# Patient Record
Sex: Female | Born: 1970 | Race: White | Hispanic: No | Marital: Married | Smoking: Current every day smoker
Health system: Southern US, Community
[De-identification: ages and names within clinical notes are randomized; demographics above are authoritative.]

## PROBLEM LIST (undated history)

## (undated) DIAGNOSIS — K219 Gastro-esophageal reflux disease without esophagitis: Secondary | ICD-10-CM

## (undated) DIAGNOSIS — E785 Hyperlipidemia, unspecified: Secondary | ICD-10-CM

## (undated) DIAGNOSIS — I10 Essential (primary) hypertension: Secondary | ICD-10-CM

## (undated) DIAGNOSIS — M7989 Other specified soft tissue disorders: Secondary | ICD-10-CM

## (undated) DIAGNOSIS — G43909 Migraine, unspecified, not intractable, without status migrainosus: Secondary | ICD-10-CM

## (undated) DIAGNOSIS — Z720 Tobacco use: Secondary | ICD-10-CM

## (undated) DIAGNOSIS — E559 Vitamin D deficiency, unspecified: Secondary | ICD-10-CM

## (undated) DIAGNOSIS — K5790 Diverticulosis of intestine, part unspecified, without perforation or abscess without bleeding: Secondary | ICD-10-CM

## (undated) DIAGNOSIS — J439 Emphysema, unspecified: Secondary | ICD-10-CM

## (undated) DIAGNOSIS — G939 Disorder of brain, unspecified: Secondary | ICD-10-CM

## (undated) DIAGNOSIS — E538 Deficiency of other specified B group vitamins: Secondary | ICD-10-CM

## (undated) DIAGNOSIS — M1990 Unspecified osteoarthritis, unspecified site: Secondary | ICD-10-CM

## (undated) DIAGNOSIS — F419 Anxiety disorder, unspecified: Secondary | ICD-10-CM

## (undated) DIAGNOSIS — E876 Hypokalemia: Secondary | ICD-10-CM

## (undated) HISTORY — DX: Migraine, unspecified, not intractable, without status migrainosus: G43.909

## (undated) HISTORY — DX: Unspecified osteoarthritis, unspecified site: M19.90

## (undated) HISTORY — DX: Gastro-esophageal reflux disease without esophagitis: K21.9

## (undated) HISTORY — PX: COLONOSCOPY WITH ESOPHAGOGASTRODUODENOSCOPY (EGD): SHX5779

## (undated) HISTORY — DX: Essential (primary) hypertension: I10

## (undated) HISTORY — DX: Hyperlipidemia, unspecified: E78.5

## (undated) HISTORY — PX: OTHER SURGICAL HISTORY: SHX169

## (undated) HISTORY — PX: ABDOMINAL HYSTERECTOMY: SHX81

---

## 2005-12-15 ENCOUNTER — Emergency Department: Payer: Self-pay | Admitting: Emergency Medicine

## 2006-01-01 ENCOUNTER — Emergency Department: Payer: Self-pay | Admitting: Emergency Medicine

## 2008-10-18 ENCOUNTER — Emergency Department: Payer: Self-pay | Admitting: Internal Medicine

## 2008-11-29 ENCOUNTER — Ambulatory Visit: Payer: Self-pay | Admitting: Gastroenterology

## 2009-06-02 ENCOUNTER — Emergency Department: Payer: Self-pay | Admitting: Emergency Medicine

## 2010-06-06 ENCOUNTER — Ambulatory Visit: Payer: Self-pay | Admitting: Family Medicine

## 2010-12-14 ENCOUNTER — Emergency Department: Payer: Self-pay | Admitting: Emergency Medicine

## 2011-10-09 ENCOUNTER — Emergency Department: Payer: Self-pay | Admitting: *Deleted

## 2014-01-26 ENCOUNTER — Emergency Department: Payer: Self-pay | Admitting: Emergency Medicine

## 2015-01-29 ENCOUNTER — Encounter: Payer: Self-pay | Admitting: Emergency Medicine

## 2015-01-29 ENCOUNTER — Emergency Department
Admission: EM | Admit: 2015-01-29 | Discharge: 2015-01-29 | Disposition: A | Discharge status: Home / Self Care | Payer: Self-pay | Attending: Student | Admitting: Student

## 2015-01-29 DIAGNOSIS — Z720 Tobacco use: Secondary | ICD-10-CM | POA: Insufficient documentation

## 2015-01-29 DIAGNOSIS — G5602 Carpal tunnel syndrome, left upper limb: Secondary | ICD-10-CM | POA: Insufficient documentation

## 2015-01-29 DIAGNOSIS — M7712 Lateral epicondylitis, left elbow: Secondary | ICD-10-CM | POA: Insufficient documentation

## 2015-01-29 MED ORDER — IBUPROFEN 600 MG PO TABS
ORAL_TABLET | ORAL | Status: AC
  Administered 2015-01-29: 600 mg
  Filled 2015-01-29: qty 1

## 2015-01-29 MED ORDER — IBUPROFEN 600 MG PO TABS: 600.0000 mg | ORAL_TABLET | Freq: Four times a day (QID) | ORAL | Status: DC | PRN

## 2015-01-29 NOTE — ED Provider Notes (Addendum)
[facility] Emergency Department Provider Note  ____________________________________________  Time seen: Approximately 3:59 AM  I have reviewed the triage vital signs and the nursing notes.   HISTORY  Chief Complaint Elbow Pain    HPI Haley Hester is a 44 y.o. female with no chronic medical problems who presents for evaluation of several weeks' intermittent sharp shooting pains from the elbow into her left hand, usually sudden onset and precipitated by her bumping her left elbow on something. Currently her pain is mild. 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 She denies any recent significant trauma to the elbow. She denies any weakness. Currently her symptoms are mild. She has no other complaints.   History reviewed. No pertinent past medical history.  There are no active problems to display for this patient.   Past Surgical History  Procedure Laterality Date  . Abdominal hysterectomy      No current outpatient prescriptions on file.  Allergies Review of patient's allergies indicates no known allergies.  History reviewed. No pertinent family history.  Social History Social History  Substance Use Topics  . Smoking status: Current Every Day Smoker -- 0.50 packs/day    Types: Cigarettes  . Smokeless tobacco: None  . Alcohol Use: Yes    Review of Systems Constitutional: No fever/chills Eyes: No visual changes. ENT: No sore throat. Cardiovascular: Denies chest pain. Respiratory: Denies shortness of breath. Gastrointestinal: No abdominal pain.  No nausea, no vomiting.  No diarrhea.  No constipation. Genitourinary: Negative for dysuria. Musculoskeletal: Negative for back pain. Skin: Negative for rash. Neurological: Negative for headaches, focal weakness or numbness.  10-point ROS otherwise  negative.  ____________________________________________   PHYSICAL EXAM:  VITAL SIGNS: ED Triage Vitals  Enc Vitals Group     BP 01/29/15 0333 174/105 mmHg     Pulse Rate 01/29/15 0333 83     Resp 01/29/15 0333 18     Temp 01/29/15 0333 98 F (36.7 C)     Temp Source 01/29/15 0333 Oral     SpO2 01/29/15 0333 98 %     Weight 01/29/15 0333 150 lb (68.04 kg)     Height 01/29/15 0333 5\' 8"  (1.727 m)     Head Cir --      Peak Flow --      Pain Score 01/29/15 0335 0     Pain Loc --      Pain Edu? --      Excl. in [place]? --     Constitutional: Alert and oriented. Well appearing and in no acute distress. Eyes: Conjunctivae are normal. PERRL. EOMI. Head: Atraumatic. Nose: No congestion/rhinnorhea. Mouth/Throat: Mucous membranes are moist.  Oropharynx non-erythematous. Neck: No stridor.  Cardiovascular: Normal rate, regular rhythm. Grossly normal heart sounds.  Good peripheral circulation. Respiratory: Normal respiratory effort.  No retractions. Lungs CTAB. Gastrointestinal: Soft and nontender. No distention. No abdominal bruits. No CVA tenderness. Genitourinary: deferred Musculoskeletal: Very faint tenderness of the elbow but full range of motion and range of motion appears painless, there is no warmth or erythema or effusion associated with the joint, 2+ left radial pulse, motor function of the radial, median and ulnar nerve are intact. She has slightly diminished sensation to light touch in the palmar aspect of the left index finger. Neurologic:  Normal speech and language. No gross focal neurologic deficits are appreciated. No gait instability. Skin:  Skin is warm, dry and intact. No rash noted. Psychiatric: Mood and affect are normal. Speech and behavior are normal.  ____________________________________________   LABS (all labs ordered are listed, but only abnormal results are displayed)  Labs Reviewed - No data to  display ____________________________________________  EKG  none ____________________________________________  RADIOLOGY  none ____________________________________________   PROCEDURES  Procedure(s) performed: None  Critical Care performed: No  ____________________________________________   INITIAL IMPRESSION / ASSESSMENT AND PLAN / ED COURSE  Pertinent labs & imaging results that were available during my care of the patient were reviewed by me and considered in my medical decision making (see chart for details).  Haley Hester is a 44 y.o. female with no chronic medical problems who presents for evaluation of several weeks' intermittent sharp shooting pains from the elbow into her left hand, usually sudden onset and precipitated by her bumping her left elbow on something. On exam, she is well-appearing and in NAD. VSS and she is afebrile. I suspect she may have tendinitis of the elbow however she is also exhibiting symptoms of carpal tunnel syndrome given her paresthesias in the left index finger and pain into the forearm. Of note the index finger is warm and well perfused though she reported in triage that it felt "cold". She has no weakness/motor compromise. We discussed use of Velcro wrist splint, anti-inflammatory medications and rest. She'll follow-up with her primary care doctor as well as orthopedic surgery as needed. DC with return precautions. ____________________________________________   FINAL CLINICAL IMPRESSION(S) / ED DIAGNOSES  Final diagnoses:  Lateral epicondylitis (tennis elbow), left  Carpal tunnel syndrome of left wrist      Joanne Gavel, MD 01/29/15 [place], MD 01/29/15 [phone]

## 2015-01-29 NOTE — ED Notes (Signed)
Velcro splint applied to right wrist and medicated with ibuprofen per verbal order.

## 2015-01-29 NOTE — ED Notes (Signed)
Before triage pt said she needed to run back out to her car because she thinks she left her cellphone on the seat

## 2015-01-29 NOTE — ED Notes (Addendum)
Pt says she hit her left elbow on something about a year ago at work and had shooting pains in her arm; was never evaluated for the pain; about 2 weeks ago she was at work and had the sharp pains again without injury; pain down to her fingertips; says 2nd digit of left hand is "cold"; full ROM; pt says she came in tonight after she had a sharp pain in her elbow around midnight; currently no pain, just tender to touch

## 2015-06-13 ENCOUNTER — Emergency Department
Admission: EM | Admit: 2015-06-13 | Discharge: 2015-06-13 | Disposition: A | Discharge status: Home / Self Care | Payer: Self-pay | Attending: Emergency Medicine | Admitting: Emergency Medicine

## 2015-06-13 DIAGNOSIS — F17210 Nicotine dependence, cigarettes, uncomplicated: Secondary | ICD-10-CM | POA: Insufficient documentation

## 2015-06-13 DIAGNOSIS — J0100 Acute maxillary sinusitis, unspecified: Secondary | ICD-10-CM | POA: Insufficient documentation

## 2015-06-13 DIAGNOSIS — H6983 Other specified disorders of Eustachian tube, bilateral: Secondary | ICD-10-CM

## 2015-06-13 DIAGNOSIS — R030 Elevated blood-pressure reading, without diagnosis of hypertension: Secondary | ICD-10-CM | POA: Insufficient documentation

## 2015-06-13 DIAGNOSIS — H6993 Unspecified Eustachian tube disorder, bilateral: Secondary | ICD-10-CM | POA: Insufficient documentation

## 2015-06-13 MED ORDER — FEXOFENADINE-PSEUDOEPHED ER 60-120 MG PO TB12: 1.0000 | ORAL_TABLET | Freq: Two times a day (BID) | ORAL | Status: DC

## 2015-06-13 MED ORDER — AMOXICILLIN 875 MG PO TABS: 875.0000 mg | ORAL_TABLET | Freq: Two times a day (BID) | ORAL | Status: DC

## 2015-06-13 NOTE — ED Provider Notes (Signed)
[facility] Emergency Department Provider Note  ____________________________________________  Time seen: Approximately 8:39 AM  I have reviewed the triage vital signs and the nursing notes.   HISTORY  Chief Complaint Otalgia    HPI Haley Hester is a 45 y.o. female patient state nasal congestion and bilateral ear pressure pain and sore throat. Patient say complaint for 2 weeks. Patient states she is taking some over-the-counter Advil with only mild relief. Patient denies any nausea vomiting diarrhea. Patient rates her pain discomfort as 8/10. No other palliative measures for his complaint.   No past medical history on file.  There are no active problems to display for this patient.   Past Surgical History  Procedure Laterality Date  . Abdominal hysterectomy      Current Outpatient Rx  Name  Route  Sig  Dispense  Refill  . amoxicillin (AMOXIL) 875 MG tablet   Oral   Take 1 tablet (875 mg total) by mouth 2 (two) times daily.   20 tablet   0   . fexofenadine-pseudoephedrine (ALLEGRA-D) 60-120 MG 12 hr tablet   Oral   Take 1 tablet by mouth 2 (two) times daily.   30 tablet   0   . ibuprofen (ADVIL,MOTRIN) 600 MG tablet   Oral   Take 1 tablet (600 mg total) by mouth every 6 (six) hours as needed for moderate pain.   15 tablet   0     Allergies Review of patient's allergies indicates no known allergies.  No family history on file.  Social History Social History  Substance Use Topics  . Smoking status: Current Every Day Smoker -- 0.50 packs/day    Types: Cigarettes  . Smokeless tobacco: Not on file  . Alcohol Use: Yes    Review of Systems Constitutional: No fever/chills Eyes: No visual changes. ENT: Sore throat. Nasal congestion and bilateral ear pain Cardiovascular: Denies chest pain. Respiratory: Denies shortness of breath. Gastrointestinal: No abdominal pain.  No nausea, no vomiting.  No diarrhea.  No  constipation. Genitourinary: Negative for dysuria. Musculoskeletal: Negative for back pain. Skin: Negative for rash. Neurological: Negative for headaches, focal weakness or numbness. 10-point ROS otherwise negative.  ____________________________________________   PHYSICAL EXAM:  VITAL SIGNS: ED Triage Vitals  Enc Vitals Group     BP 06/13/15 0821 155/89 mmHg     Pulse Rate 06/13/15 0821 90     Resp 06/13/15 0821 18     Temp 06/13/15 0821 97.8 F (36.6 C)     Temp Source 06/13/15 0821 Oral     SpO2 06/13/15 0821 99 %     Weight 06/13/15 0821 143 lb (64.864 kg)     Height 06/13/15 0821 5\' 8"  (1.727 m)     Head Cir --      Peak Flow --      Pain Score 06/13/15 0818 8     Pain Loc --      Pain Edu? --      Excl. in [place]? --     Constitutional: Alert and oriented. Well appearing and in no acute distress. Eyes: Conjunctivae are normal. PERRL. EOMI. Head: Atraumatic. Nose: Bilateral edematous nasal turbinates. Bilateral maxillary guarding. Postnasal drainage. Mouth/Throat: Mucous membranes are moist.  Oropharynx erythematous. Neck: No stridor.  No cervical spine tenderness to palpation. Hematological/Lymphatic/Immunilogical: No cervical lymphadenopathy. Cardiovascular: Normal rate, regular rhythm. Grossly normal heart sounds.  Good peripheral circulation. Elevation of systolic blood pressure Respiratory: Normal respiratory effort.  No retractions. Lungs CTAB. Gastrointestinal: Soft  and nontender. No distention. No abdominal bruits. No CVA tenderness. Musculoskeletal: No lower extremity tenderness nor edema.  No joint effusions. Neurologic:  Normal speech and language. No gross focal neurologic deficits are appreciated. No gait instability. Skin:  Skin is warm, dry and intact. No rash noted. Psychiatric: Mood and affect are normal. Speech and behavior are normal.  ____________________________________________   LABS (all labs ordered are listed, but only abnormal results are  displayed)  Labs Reviewed - No data to display ____________________________________________  EKG   ____________________________________________  RADIOLOGY   ____________________________________________   PROCEDURES  Procedure(s) performed: None  Critical Care performed: No  ____________________________________________   INITIAL IMPRESSION / ASSESSMENT AND PLAN / ED COURSE  Pertinent labs & imaging results that were available during my care of the patient were reviewed by me and considered in my medical decision making (see chart for details).  Maxillary sinusitis with eustachian tube dysfunction. Patient given discharge care instructions. Given prescription for amoxicillin and Allegra-D. Patient given a work note for one day. Patient advised to follow-up with family doctor if condition persists. ____________________________________________   FINAL CLINICAL IMPRESSION(S) / ED DIAGNOSES  Final diagnoses:  Acute maxillary sinusitis, recurrence not specified  Eustachian tube dysfunction, bilateral      Sable Feil, PA-C 06/13/15 [place], PA-C 06/13/15 0845  Gregor Hams, MD 06/13/15 [phone]

## 2015-06-13 NOTE — ED Notes (Signed)
Pt reports bilateral ear pain x3 days; reports chills. Pt alert and oriented in triage, no apparent distress noted.

## 2015-06-13 NOTE — ED Notes (Signed)
States bilateral ear pain, states dry scratchy full throat, pt in no acute distress

## 2016-02-21 DIAGNOSIS — M47816 Spondylosis without myelopathy or radiculopathy, lumbar region: Secondary | ICD-10-CM | POA: Insufficient documentation

## 2016-02-21 DIAGNOSIS — M7540 Impingement syndrome of unspecified shoulder: Secondary | ICD-10-CM | POA: Insufficient documentation

## 2016-02-21 DIAGNOSIS — M542 Cervicalgia: Secondary | ICD-10-CM | POA: Insufficient documentation

## 2016-02-26 ENCOUNTER — Ambulatory Visit: Payer: Self-pay | Admitting: Primary Care

## 2016-03-07 ENCOUNTER — Encounter: Payer: Self-pay | Admitting: Primary Care

## 2016-03-07 ENCOUNTER — Ambulatory Visit (INDEPENDENT_AMBULATORY_CARE_PROVIDER_SITE_OTHER): Payer: 59 | Admitting: Primary Care

## 2016-03-07 VITALS — BP 148/94 | HR 60 | Temp 98.2°F | Ht 65.5 in | Wt 132.0 lb

## 2016-03-07 DIAGNOSIS — Z23 Encounter for immunization: Secondary | ICD-10-CM

## 2016-03-07 DIAGNOSIS — K219 Gastro-esophageal reflux disease without esophagitis: Secondary | ICD-10-CM

## 2016-03-07 DIAGNOSIS — E785 Hyperlipidemia, unspecified: Secondary | ICD-10-CM | POA: Diagnosis not present

## 2016-03-07 DIAGNOSIS — F411 Generalized anxiety disorder: Secondary | ICD-10-CM

## 2016-03-07 DIAGNOSIS — R232 Flushing: Secondary | ICD-10-CM | POA: Diagnosis not present

## 2016-03-07 LAB — COMPREHENSIVE METABOLIC PANEL
ALBUMIN: 4.8 g/dL (ref 3.5–5.2)
ALT: 18 U/L (ref 0–35)
AST: 18 U/L (ref 0–37)
Alkaline Phosphatase: 116 U/L (ref 39–117)
BILIRUBIN TOTAL: 0.4 mg/dL (ref 0.2–1.2)
BUN: 11 mg/dL (ref 6–23)
CALCIUM: 10 mg/dL (ref 8.4–10.5)
CHLORIDE: 102 meq/L (ref 96–112)
CO2: 28 meq/L (ref 19–32)
CREATININE: 0.62 mg/dL (ref 0.40–1.20)
GFR: 109.83 mL/min (ref >60.00)
Glucose, Bld: 75 mg/dL (ref 70–99)
Potassium: 4.1 mEq/L (ref 3.5–5.1)
Sodium: 137 mEq/L (ref 135–145)
Total Protein: 7.7 g/dL (ref 6.0–8.3)

## 2016-03-07 LAB — LIPID PANEL
CHOL/HDL RATIO: 8
CHOLESTEROL: 338 mg/dL — AB (ref 0–200)
HDL: 41.4 mg/dL (ref >39.00)
LDL CALC: 259 mg/dL — AB (ref 0–99)
NonHDL: 296.58
TRIGLYCERIDES: 189 mg/dL — AB (ref 0.0–149.0)
VLDL: 37.8 mg/dL (ref 0.0–40.0)

## 2016-03-07 MED ORDER — PAROXETINE HCL 20 MG PO TABS: 20.0000 mg | ORAL_TABLET | Freq: Every day | ORAL | 1 refills | Status: DC

## 2016-03-07 MED ORDER — OMEPRAZOLE 20 MG PO CPDR: 20.0000 mg | DELAYED_RELEASE_CAPSULE | Freq: Every day | ORAL | 0 refills | Status: DC

## 2016-03-07 NOTE — Progress Notes (Signed)
Subjective:    Patient ID: Haley Hester, female    DOB: June 29, 1970, 45 y.o.   MRN: [id number]  HPI  [name] is a 45 year old female who presents today to establish care and discuss the problems mentioned below. Will obtain old records.  1) Elevated Blood Pressure: Blood pressure in the office today is 148/94. She does not check her blood pressure regularly. She's never been managed on medication in the past. She denies visual changes, chest pain, shortness of breath.  2) Hyperlipidemia: Diagnosed years ago. Previously managed on a medication for which she cannot remember. No recent lipid panel as she's not had care in several years.   3) Generalized Anxiety Disorder: Diagnosed in 2012. Insure of what she's taken in the past but believes she was taking Lorazepam or Diazepam, no controller medication. She's not had these medications since 2013 as she's not sought care since then. She believes her anxiety is uncontrolled. She experiences symptoms of feeling overwhelmed, chest tightness, insomnia, and anxiety. She has difficulty falling asleep and staying. GAD 7 score of 18 and PHQ 9 score of 5 today. She denies SI/HI.  4) GERD: Previously managed on Omeprazole 20 mg BID for which she's not taken since 2013 due to cost. She completed endoscopy and colonoscopy in early 2000. She experiences symptoms of epigastric and esophageal discomfort, esophageal burning daily. She's been taking Pepcid or Zantac for her symptoms with some improvement, but never any continued relief. She avoids trigger foods and avoids laying down within 2 hours after eating.   5) Osteoarthritis: Currently following with Dr. Sabra [facility] at [facility] and is taking Meloxicam and methocarbamol.   Review of Systems  Constitutional: Negative for unexpected weight change.  Eyes: Negative for visual disturbance.  Respiratory: Negative for shortness of breath.   Cardiovascular: Negative for chest pain.  Gastrointestinal:        Gerd symptoms  Genitourinary:       Hysterectomy   Musculoskeletal: Positive for arthralgias.  Skin: Negative for color change.  Allergic/Immunologic: Negative for environmental allergies.  Psychiatric/Behavioral: Positive for sleep disturbance. Negative for suicidal ideas. The patient is nervous/anxious.        Past Medical History:  Diagnosis Date  . Arthritis   . GERD (gastroesophageal reflux disease)   . Hyperlipidemia   . Hypertension   . Migraines      Social History   Social History  . Marital status: Divorced    Spouse name: N/A  . Number of children: N/A  . Years of education: N/A   Occupational History  . Not on file.   Social History Main Topics  . Smoking status: Current Every Day Smoker    Packs/day: 0.50    Types: Cigarettes  . Smokeless tobacco: Not on file  . Alcohol use Yes  . Drug use: Unknown  . Sexual activity: Not on file   Other Topics Concern  . Not on file   Social History Narrative  . No narrative on file    Past Surgical History:  Procedure Laterality Date  . ABDOMINAL HYSTERECTOMY      No family history on file.  No Known Allergies  No current outpatient prescriptions on file prior to visit.   No current facility-administered medications on file prior to visit.     BP (!) 148/94   Pulse 60   Temp 98.2 F (36.8 C) (Oral)   Ht 5' 5.5" (1.664 m)   Wt 132 lb (59.9 kg)   SpO2  98%   BMI 21.63 kg/m    Objective:   Physical Exam  Constitutional: She is oriented to person, place, and time. She appears well-nourished.  HENT:  Head: Normocephalic.  Neck: Neck supple.  Cardiovascular: Normal rate and regular rhythm.   Pulmonary/Chest: Effort normal and breath sounds normal.  Neurological: She is alert and oriented to person, place, and time.  Skin: Skin is warm and dry.  Psychiatric: She has a normal mood and affect.          Assessment & Plan:

## 2016-03-07 NOTE — Progress Notes (Signed)
Pre visit review using our clinic review tool, if applicable. No additional management support is needed unless otherwise documented below in the visit note. 

## 2016-03-07 NOTE — Patient Instructions (Addendum)
Start paroxetine (Paxil) 20 mg tablets for anxiety and hot flashes. Take 1/2 tablet by mouth once daily for 8 days, then take 1 full tablet thereafter.  Start Omeprazole 20 mg for acid reflux. Take 1 tablet by mouth once daily.  Complete lab work prior to leaving today. I will notify you of your results once received.   Please schedule a physical with me in the near future at your convenience. I would like to see you back within 6 weeks.  It was a pleasure to meet you today! Please don't hesitate to call me with any questions. Welcome to [facility]!

## 2016-03-08 ENCOUNTER — Other Ambulatory Visit: Payer: Self-pay | Admitting: Primary Care

## 2016-03-08 DIAGNOSIS — R232 Flushing: Secondary | ICD-10-CM | POA: Insufficient documentation

## 2016-03-08 DIAGNOSIS — E782 Mixed hyperlipidemia: Secondary | ICD-10-CM

## 2016-03-08 DIAGNOSIS — F411 Generalized anxiety disorder: Secondary | ICD-10-CM | POA: Insufficient documentation

## 2016-03-08 DIAGNOSIS — E785 Hyperlipidemia, unspecified: Secondary | ICD-10-CM | POA: Insufficient documentation

## 2016-03-08 DIAGNOSIS — K219 Gastro-esophageal reflux disease without esophagitis: Secondary | ICD-10-CM | POA: Insufficient documentation

## 2016-03-08 DIAGNOSIS — M1990 Unspecified osteoarthritis, unspecified site: Secondary | ICD-10-CM | POA: Insufficient documentation

## 2016-03-08 MED ORDER — ATORVASTATIN CALCIUM 40 MG PO TABS: ORAL_TABLET | ORAL | 1 refills | Status: DC

## 2016-03-08 NOTE — Assessment & Plan Note (Signed)
Previously managed on medication, cannot remember. Pharmacy called and has no record. Lipids pending today. Will initiate treatment if necessary.

## 2016-03-08 NOTE — Assessment & Plan Note (Signed)
Uncontrolled on max H2 Blockers. Will send omeprazole 20 mg once daily to pharmacy. If no improvement, consider increasing dose or pantoprazole. Discussed long term effects of PPI use.

## 2016-03-08 NOTE — Assessment & Plan Note (Signed)
Hysterectomy. Hot flashes for years. Will initiate Paxil for GAD for secondary treatment of hot flashes.

## 2016-03-08 NOTE — Assessment & Plan Note (Signed)
Generalized, mostly to knees, back, hips. Following with [facility]. Overall no improvement on Meloxicam and Robaxin. She has a follow up appointment scheduled later next month.

## 2016-03-08 NOTE — Assessment & Plan Note (Signed)
Long history of, previously managed on either Lorazezpam or Diazepam. No recent med use. Anxiety uncontrolled, GAD 7 score of 18.  Will start Paxil 20 mg. Patient is to take 1/2 tablet daily for 8 days, then advance to 1 full tablet thereafter. We discussed possible side effects of headache, GI upset, drowsiness, and SI/HI. If thoughts of SI/HI develop, we discussed to present to the emergency immediately. Patient verbalized understanding.   Follow up in 4-6 weeks for re-evaluation.

## 2016-03-11 ENCOUNTER — Telehealth: Payer: Self-pay | Admitting: Primary Care

## 2016-03-11 ENCOUNTER — Encounter: Payer: Self-pay | Admitting: *Deleted

## 2016-03-11 NOTE — Telephone Encounter (Signed)
Rec'd from Dr.Bliss forward 35 pages to [facility]

## 2016-03-11 NOTE — Telephone Encounter (Signed)
Spoken and notified patient of Kate's comments. Patient verbalized understanding. 

## 2016-03-11 NOTE — Telephone Encounter (Signed)
Pt returned your call, need to cb before 3 pm.

## 2016-05-02 ENCOUNTER — Other Ambulatory Visit: Payer: Self-pay | Admitting: Primary Care

## 2016-05-02 DIAGNOSIS — K219 Gastro-esophageal reflux disease without esophagitis: Secondary | ICD-10-CM

## 2016-05-05 ENCOUNTER — Encounter: Payer: Self-pay | Admitting: Primary Care

## 2016-05-05 ENCOUNTER — Ambulatory Visit (INDEPENDENT_AMBULATORY_CARE_PROVIDER_SITE_OTHER): Payer: 59 | Admitting: Primary Care

## 2016-05-05 VITALS — BP 146/96 | HR 78 | Temp 98.1°F | Ht 65.5 in | Wt 137.1 lb

## 2016-05-05 DIAGNOSIS — J069 Acute upper respiratory infection, unspecified: Secondary | ICD-10-CM | POA: Diagnosis not present

## 2016-05-05 DIAGNOSIS — F411 Generalized anxiety disorder: Secondary | ICD-10-CM

## 2016-05-05 DIAGNOSIS — I10 Essential (primary) hypertension: Secondary | ICD-10-CM | POA: Diagnosis not present

## 2016-05-05 MED ORDER — AZITHROMYCIN 250 MG PO TABS: ORAL_TABLET | ORAL | 0 refills | Status: DC

## 2016-05-05 MED ORDER — PAROXETINE HCL 40 MG PO TABS: 40.0000 mg | ORAL_TABLET | ORAL | 1 refills | Status: DC

## 2016-05-05 MED ORDER — HYDROCHLOROTHIAZIDE 25 MG PO TABS: 25.0000 mg | ORAL_TABLET | Freq: Every day | ORAL | 1 refills | Status: DC

## 2016-05-05 NOTE — Assessment & Plan Note (Signed)
Long history of elevated readings without treatment. Rx for HCTZ sent to pharmacy. Follow up in 3 weeks for recheck and BMP.

## 2016-05-05 NOTE — Assessment & Plan Note (Signed)
Overall improvement on Paxil, sounds like she's not quite at goal. Will increase dose to 40 mg daily. New Rx sent to pharmacy. She will update if no improvement. Denies SI/HI.

## 2016-05-05 NOTE — Progress Notes (Signed)
Pre visit review using our clinic review tool, if applicable. No additional management support is needed unless otherwise documented below in the visit note. 

## 2016-05-05 NOTE — Patient Instructions (Signed)
Your symptoms are representative of a viral illness which will resolve on its own over time. Our goal is to treat your symptoms in order to aid your body in the healing process and to make you more comfortable. Start taking the Tussin cough medication as discussed.  If your symptoms do not improve by Wednesday this week, fill the antibiotic. Take 2 tablets by mouth on day 1, then 1 tablet daily for 4 additional days.  We've increased the dose of your Paxil from 20 mg to 40 mg. You may take two of the 20 mg tablets until your current bottle is empty. Only take one of the 40 mg tablets when you pick it up from the pharmacy.  Start hydrochlorothiazide 25 mg tablets for high blood pressure. Take 1 tablet by mouth once daily.  Schedule a follow up visit in 3 weeks to recheck your blood pressure.  It was a pleasure to see you today!

## 2016-05-05 NOTE — Progress Notes (Signed)
Subjective:    Patient ID: Haley Hester, female    DOB: 10-Jan-1971, 47 y.o.   MRN: [id number]  HPI  Haley Hester is a 46 year old female who presents today with a chief complaint of cough and also to discuss GAD.  1) Cough: Also with symptoms of nasal congestion, fatigue. She denies sore throat, fevers, nausea, abodminal pain. Her cough has been present for the past 1 week. Overall she's feeling about the same. She's taken Alka-Seltzer cold and flu and NyQuil with some improvement. She continues to feel fatigued.  2) GAD: Currently managed on Paxil 20 mg that was initiated in November 2017. She has a long history of anxiety disorder and was previously managed on lorazepam and diazepam. Since initiation of Paxil she's noticed some improvement, but does have some anxious feelings. The positive effects include less restlessness, less overall anxiety, less mind racing.  3) Essential Hypertension: Above goal during last visit and evident on prior visits as well. History of hypertension for years but has never been treated with medication. She has a strong family history of hypertension. She denies chest pain, dizziness, headaches.  Review of Systems  Constitutional: Positive for fatigue. Negative for fever.  HENT: Positive for congestion. Negative for ear pain, sinus pressure and sore throat.   Respiratory: Positive for cough. Negative for shortness of breath.   Cardiovascular: Negative for chest pain.  Neurological: Negative for dizziness and headaches.  Psychiatric/Behavioral: The patient is nervous/anxious.        Overall anxiety improved       Past Medical History:  Diagnosis Date  . Arthritis   . GERD (gastroesophageal reflux disease)   . Hyperlipidemia   . Hypertension   . Migraines      Social History   Social History  . Marital status: Divorced    Spouse name: N/A  . Number of children: N/A  . Years of education: N/A   Occupational History  . Not on file.   Social  History Main Topics  . Smoking status: Current Every Day Smoker    Packs/day: 0.50    Types: Cigarettes  . Smokeless tobacco: Not on file  . Alcohol use Yes  . Drug use: Unknown  . Sexual activity: Not on file   Other Topics Concern  . Not on file   Social History Narrative  . No narrative on file    Past Surgical History:  Procedure Laterality Date  . ABDOMINAL HYSTERECTOMY      No family history on file.  No Known Allergies  Current Outpatient Prescriptions on File Prior to Visit  Medication Sig Dispense Refill  . atorvastatin (LIPITOR) 40 MG tablet Take 1 tablet by mouth every evening for high cholesterol. 90 tablet 1  . meloxicam (MOBIC) 15 MG tablet Take 15 mg by mouth daily.  3  . methocarbamol (ROBAXIN) 500 MG tablet Take 500 mg by mouth 2 (two) times daily.  3  . omeprazole (PRILOSEC) 20 MG capsule TAKE 1 CAPSULE (20 MG TOTAL) BY MOUTH DAILY. 90 capsule 0   No current facility-administered medications on file prior to visit.     BP (!) 146/96   Pulse 78   Temp 98.1 F (36.7 C) (Oral)   Ht 5' 5.5" (1.664 m)   Wt 137 lb 1.9 oz (62.2 kg)   SpO2 98%   BMI 22.47 kg/m    Objective:   Physical Exam  Constitutional: She appears well-nourished.  HENT:  Right Ear: Tympanic membrane and  ear canal normal.  Left Ear: Tympanic membrane and ear canal normal.  Nose: Right sinus exhibits no maxillary sinus tenderness and no frontal sinus tenderness. Left sinus exhibits no maxillary sinus tenderness and no frontal sinus tenderness.  Mouth/Throat: Oropharynx is clear and moist.  Eyes: Conjunctivae are normal.  Neck: Neck supple.  Cardiovascular: Normal rate and regular rhythm.   Pulmonary/Chest: Effort normal. She has wheezes in the right upper field and the left upper field. She has no rales.  Mild wheezing overall  Lymphadenopathy:    She has no cervical adenopathy.  Skin: Skin is warm and dry.  Psychiatric: She has a normal mood and affect.            Assessment & Plan:  URI:  Cough, congestion, fatigue x 1 week. Overall feeling about the same with some improvement in OTC treatment. Exam today not suggestive of bacterial involvement. Mild wheezing likely from history of tobacco abuse. Will have her wait out symptoms for the next 48 hours, if no improvement or if symptoms become worse, then will have her Hester Zpak. Start Robitussin for cough. Mucinex. Fluids, rest, follow up PRN.  Sheral Flow, NP

## 2016-05-28 ENCOUNTER — Ambulatory Visit: Payer: 59 | Admitting: Primary Care

## 2016-06-09 ENCOUNTER — Ambulatory Visit (INDEPENDENT_AMBULATORY_CARE_PROVIDER_SITE_OTHER): Payer: 59 | Admitting: Primary Care

## 2016-06-09 ENCOUNTER — Encounter: Payer: Self-pay | Admitting: Primary Care

## 2016-06-09 VITALS — BP 142/92 | HR 78 | Temp 98.1°F | Ht 65.5 in | Wt 133.0 lb

## 2016-06-09 DIAGNOSIS — I10 Essential (primary) hypertension: Secondary | ICD-10-CM

## 2016-06-09 MED ORDER — LISINOPRIL 10 MG PO TABS: ORAL_TABLET | ORAL | 0 refills | Status: DC

## 2016-06-09 NOTE — Assessment & Plan Note (Signed)
Little improvement with HCTZ, add in Lisinopril 10 mg. Check BMP during her next office visit in 3 weeks.

## 2016-06-09 NOTE — Progress Notes (Signed)
   Subjective:    Patient ID: Haley Hester, female    DOB: 03/20/1971, 46 y.o.   MRN: [id number]  HPI  [name] is a 46 year old female who presents today for follow up of hypertension. She is currently managed on HCTZ 25 mg that was initiated on 05/05/16. She has a long history of untreated high blood pressure.  Her BP in the clinic today is 142/92. She does not have a cuff at home and has not been checking her BP at the drug store. She denies chest pain, dizziness, shortness of breath, lower extremity edema.  Review of Systems  Constitutional: Negative for fatigue.  Eyes: Negative for visual disturbance.  Respiratory: Negative for shortness of breath.   Cardiovascular: Negative for chest pain.  Neurological: Negative for dizziness and weakness.       Past Medical History:  Diagnosis Date  . Arthritis   . GERD (gastroesophageal reflux disease)   . Hyperlipidemia   . Hypertension   . Migraines      Social History   Social History  . Marital status: Divorced    Spouse name: N/A  . Number of children: N/A  . Years of education: N/A   Occupational History  . Not on file.   Social History Main Topics  . Smoking status: Current Every Day Smoker    Packs/day: 0.50    Types: Cigarettes  . Smokeless tobacco: Never Used  . Alcohol use Yes  . Drug use: Unknown  . Sexual activity: Not on file   Other Topics Concern  . Not on file   Social History Narrative  . No narrative on file    Past Surgical History:  Procedure Laterality Date  . ABDOMINAL HYSTERECTOMY      No family history on file.  No Known Allergies  Current Outpatient Prescriptions on File Prior to Visit  Medication Sig Dispense Refill  . atorvastatin (LIPITOR) 40 MG tablet Take 1 tablet by mouth every evening for high cholesterol. 90 tablet 1  . hydrochlorothiazide (HYDRODIURIL) 25 MG tablet Take 1 tablet (25 mg total) by mouth daily. 30 tablet 1  . meloxicam (MOBIC) 15 MG tablet Take 15 mg by  mouth daily.  3  . methocarbamol (ROBAXIN) 500 MG tablet Take 500 mg by mouth 2 (two) times daily.  3  . omeprazole (PRILOSEC) 20 MG capsule TAKE 1 CAPSULE (20 MG TOTAL) BY MOUTH DAILY. 90 capsule 0  . PARoxetine (PAXIL) 40 MG tablet Take 1 tablet (40 mg total) by mouth every morning. 90 tablet 1   No current facility-administered medications on file prior to visit.     BP (!) 142/92   Pulse 78   Temp 98.1 F (36.7 C) (Oral)   Ht 5' 5.5" (1.664 m)   Wt 133 lb (60.3 kg)   SpO2 96%   BMI 21.80 kg/m    Objective:   Physical Exam  Constitutional: She appears well-nourished.  Neck: Neck supple.  Cardiovascular: Normal rate and regular rhythm.   Pulmonary/Chest: Effort normal and breath sounds normal.  Skin: Skin is warm and dry.          Assessment & Plan:

## 2016-06-09 NOTE — Patient Instructions (Signed)
Your blood pressure is still too high.  Continue taking hydrochlorothiazide 25 mg for high blood pressure.  Start taking Lisinopril 10 mg for high blood pressure.  Follow up in 3 weeks for re-evaluation.  It was a pleasure to see you today!   Hypertension Hypertension, commonly called high blood pressure, is when the force of blood pumping through your arteries is too strong. Your arteries are the blood vessels that carry blood from your heart throughout your body. A blood pressure reading consists of a higher number over a lower number, such as 110/72. The higher number (systolic) is the pressure inside your arteries when your heart pumps. The lower number (diastolic) is the pressure inside your arteries when your heart relaxes. Ideally you want your blood pressure below 120/80. Hypertension forces your heart to work harder to pump blood. Your arteries may become narrow or stiff. Having untreated or uncontrolled hypertension can cause heart attack, stroke, kidney disease, and other problems. What increases the risk? Some risk factors for high blood pressure are controllable. Others are not. Risk factors you cannot control include:  Race. You may be at higher risk if you are African American.  Age. Risk increases with age.  Gender. Men are at higher risk than women before age 28 years. After age 38, women are at higher risk than men. Risk factors you can control include:  Not getting enough exercise or physical activity.  Being overweight.  Getting too much fat, sugar, calories, or salt in your diet.  Drinking too much alcohol. What are the signs or symptoms? Hypertension does not usually cause signs or symptoms. Extremely high blood pressure (hypertensive crisis) may cause headache, anxiety, shortness of breath, and nosebleed. How is this diagnosed? To check if you have hypertension, your health care provider will measure your blood pressure while you are seated, with your arm  held at the level of your heart. It should be measured at least twice using the same arm. Certain conditions can cause a difference in blood pressure between your right and left arms. A blood pressure reading that is higher than normal on one occasion does not mean that you need treatment. If it is not clear whether you have high blood pressure, you may be asked to return on a different day to have your blood pressure checked again. Or, you may be asked to monitor your blood pressure at home for 1 or more weeks. How is this treated? Treating high blood pressure includes making lifestyle changes and possibly taking medicine. Living a healthy lifestyle can help lower high blood pressure. You may need to change some of your habits. Lifestyle changes may include:  Following the DASH diet. This diet is high in fruits, vegetables, and whole grains. It is low in salt, red meat, and added sugars.  Keep your sodium intake below 2,300 mg per day.  Getting at least 30-45 minutes of aerobic exercise at least 4 times per week.  Losing weight if necessary.  Not smoking.  Limiting alcoholic beverages.  Learning ways to reduce stress. Your health care provider may prescribe medicine if lifestyle changes are not enough to get your blood pressure under control, and if one of the following is true:  You are 92-84 years of age and your systolic blood pressure is above 140.  You are 68 years of age or older, and your systolic blood pressure is above 150.  Your diastolic blood pressure is above 90.  You have diabetes, and your systolic blood pressure  is over [id number] or your diastolic blood pressure is over 90.  You have kidney disease and your blood pressure is above 140/90.  You have heart disease and your blood pressure is above 140/90. Your personal target blood pressure may vary depending on your medical conditions, your age, and other factors. Follow these instructions at home:  Have your blood pressure  rechecked as directed by your health care provider.  Take medicines only as directed by your health care provider. Follow the directions carefully. Blood pressure medicines must be taken as prescribed. The medicine does not work as well when you skip doses. Skipping doses also puts you at risk for problems.  Do not smoke.  Monitor your blood pressure at home as directed by your health care provider. Contact a health care provider if:  You think you are having a reaction to medicines taken.  You have recurrent headaches or feel dizzy.  You have swelling in your ankles.  You have trouble with your vision. Get help right away if:  You develop a severe headache or confusion.  You have unusual weakness, numbness, or feel faint.  You have severe chest or abdominal pain.  You vomit repeatedly.  You have trouble breathing. This information is not intended to replace advice given to you by your health care provider. Make sure you discuss any questions you have with your health care provider. Document Released: 04/07/2005 Document Revised: 09/13/2015 Document Reviewed: 01/28/2013 Elsevier Interactive Patient Education  2017 [employer].

## 2016-06-09 NOTE — Progress Notes (Signed)
Pre visit review using our clinic review tool, if applicable. No additional management support is needed unless otherwise documented below in the visit note. 

## 2016-06-30 ENCOUNTER — Ambulatory Visit: Payer: 59 | Admitting: Primary Care

## 2016-07-04 ENCOUNTER — Encounter: Payer: Self-pay | Admitting: Primary Care

## 2016-07-04 ENCOUNTER — Ambulatory Visit (INDEPENDENT_AMBULATORY_CARE_PROVIDER_SITE_OTHER): Payer: 59 | Admitting: Primary Care

## 2016-07-04 VITALS — BP 118/82 | HR 83 | Temp 98.0°F | Ht 65.5 in | Wt 136.8 lb

## 2016-07-04 DIAGNOSIS — I10 Essential (primary) hypertension: Secondary | ICD-10-CM

## 2016-07-04 DIAGNOSIS — G4700 Insomnia, unspecified: Secondary | ICD-10-CM

## 2016-07-04 DIAGNOSIS — M1990 Unspecified osteoarthritis, unspecified site: Secondary | ICD-10-CM | POA: Diagnosis not present

## 2016-07-04 DIAGNOSIS — F411 Generalized anxiety disorder: Secondary | ICD-10-CM | POA: Diagnosis not present

## 2016-07-04 DIAGNOSIS — E785 Hyperlipidemia, unspecified: Secondary | ICD-10-CM | POA: Diagnosis not present

## 2016-07-04 LAB — BASIC METABOLIC PANEL
BUN: 8 mg/dL (ref 6–23)
CALCIUM: 9.8 mg/dL (ref 8.4–10.5)
CO2: 30 meq/L (ref 19–32)
CREATININE: 0.63 mg/dL (ref 0.40–1.20)
Chloride: 99 mEq/L (ref 96–112)
GFR: 107.67 mL/min (ref >60.00)
GLUCOSE: 75 mg/dL (ref 70–99)
Potassium: 4.1 mEq/L (ref 3.5–5.1)
Sodium: 132 mEq/L — ABNORMAL LOW (ref 135–145)

## 2016-07-04 MED ORDER — CYCLOBENZAPRINE HCL 10 MG PO TABS: 10.0000 mg | ORAL_TABLET | Freq: Two times a day (BID) | ORAL | 1 refills | Status: DC | PRN

## 2016-07-04 MED ORDER — TRAZODONE HCL 50 MG PO TABS: 50.0000 mg | ORAL_TABLET | Freq: Every evening | ORAL | 1 refills | Status: DC | PRN

## 2016-07-04 MED ORDER — PAROXETINE HCL 20 MG PO TABS: 40.0000 mg | ORAL_TABLET | Freq: Every day | ORAL | 1 refills | Status: DC

## 2016-07-04 MED ORDER — LOVASTATIN 20 MG PO TABS: 20.0000 mg | ORAL_TABLET | Freq: Every day | ORAL | 3 refills | Status: DC

## 2016-07-04 MED ORDER — LISINOPRIL-HYDROCHLOROTHIAZIDE 10-12.5 MG PO TABS: 1.0000 | ORAL_TABLET | Freq: Every day | ORAL | 1 refills | Status: DC

## 2016-07-04 NOTE — Assessment & Plan Note (Signed)
Atorvastatin too expensive, switch to lovastatin. Continue to monitor lipids.

## 2016-07-04 NOTE — Progress Notes (Signed)
   Subjective:    Patient ID: Haley Hester, female    DOB: 01/19/1971, 46 y.o.   MRN: [id number]  HPI  [name] is a 46 year old female who presents today for follow up.  1) Essential Hypertension: She is currently managed on HCTZ 25 mg and lisinopril 10 mg. During her last visit we added lisinopril due to continued elevated BP readings. Her BP today is 118/82. She is compliant to her BP medications. She denies chest pain, SOB, dizziness, cough.  2) Osteoarthritis: Currently managed on Meloxicam 15 mg and methocarbamol 500 mg. She intends on seeing an orthopedist soon and is requesting cheaper alternatives to these medications as they are too costly.   3) GAD: Overall anxiety has improved, but will intermittently experience palpitations and shortness of breath. She's been using self calming techniques with improvement. She continues to struggle with insomnia and will wake during the night several times. She's tried Melatonin OTC without improvement. She was previously managed on Trazodone in the past with improvement.   Review of Systems  Eyes: Negative for visual disturbance.  Respiratory: Negative for shortness of breath.   Cardiovascular: Negative for chest pain.  Musculoskeletal: Positive for arthralgias.  Neurological: Negative for dizziness and headaches.  Psychiatric/Behavioral: Positive for sleep disturbance. Negative for suicidal ideas. The patient is nervous/anxious.        Past Medical History:  Diagnosis Date  . Arthritis   . GERD (gastroesophageal reflux disease)   . Hyperlipidemia   . Hypertension   . Migraines      Social History   Social History  . Marital status: Divorced    Spouse name: N/A  . Number of children: N/A  . Years of education: N/A   Occupational History  . Not on file.   Social History Main Topics  . Smoking status: Current Every Day Smoker    Packs/day: 0.50    Types: Cigarettes  . Smokeless tobacco: Never Used  . Alcohol use Yes    . Drug use: Unknown  . Sexual activity: Not on file   Other Topics Concern  . Not on file   Social History Narrative  . No narrative on file    Past Surgical History:  Procedure Laterality Date  . ABDOMINAL HYSTERECTOMY      No family history on file.  No Known Allergies  Current Outpatient Prescriptions on File Prior to Visit  Medication Sig Dispense Refill  . meloxicam (MOBIC) 15 MG tablet Take 15 mg by mouth daily.  3  . omeprazole (PRILOSEC) 20 MG capsule TAKE 1 CAPSULE (20 MG TOTAL) BY MOUTH DAILY. 90 capsule 0   No current facility-administered medications on file prior to visit.     BP 118/82   Pulse 83   Temp 98 F (36.7 C) (Oral)   Ht 5' 5.5" (1.664 m)   Wt 136 lb 12.8 oz (62.1 kg)   SpO2 98%   BMI 22.42 kg/m    Objective:   Physical Exam  Cardiovascular: Normal rate and regular rhythm.   Pulmonary/Chest: Effort normal and breath sounds normal.  Skin: Skin is warm and dry.  Psychiatric: She has a normal mood and affect.          Assessment & Plan:

## 2016-07-04 NOTE — Patient Instructions (Addendum)
Complete lab work prior to leaving today. I will notify you of your results once received.   We've changed a lot of your medications:  Blood Pressure:  Continue Lisinopril 10 mg and Hydrochlorothiazide 25 mg tablets until your bottles are empty.   Once your bottles are empty pick up lisinopril/hydrochlorothiazide 10-12.5 mg. Take 1 tablet by mouth once daily.  Cholesterol:  You may finish the atorvastatin 40 mg.  Once you finish the atorvastatin 40 mg, then pick up the lovastatin 20 mg. Take 1 tablet by mouth at bedtime.  Anxiety and Sleep:  You may finish the paroxetine 40 mg. Once you've completed the paroxetine 40 mg, pick up the paroxetine 20 mg tablets and take two tablets once daily.  You may take the Trazodone at bedtime as needed for sleep. Please call me and update me on the Trazodone for sleep.  Muscle Spasms:  You may finish the methocarbamol tablets. Once you've completed the methocarbamol then start cyclobenzaprine tablets. Take 1 tablet by mouth twice daily as needed.  Monitor your blood pressure and notify me if you get readings at or above 140/90.  It was a pleasure to see you today!

## 2016-07-04 NOTE — Assessment & Plan Note (Addendum)
Overall seems stable, able to use self calming techniques. Doesn't seem bad enough to add in second medication. Did send in Paxil 20 mg tablets as they are more cost effective.  Will start Trazodone PRN sleep. She will update in regards to effectives.   Continue to monitor.

## 2016-07-04 NOTE — Assessment & Plan Note (Signed)
Improved on current regimen. Will combine tablets into lisinopril/HCTZ 10/12.5 mg as she is tight on money. Will have her monitor BP and report readings at or above 135/90. BMP pending.

## 2016-07-04 NOTE — Progress Notes (Signed)
Pre visit review using our clinic review tool, if applicable. No additional management support is needed unless otherwise documented below in the visit note. 

## 2016-07-04 NOTE — Assessment & Plan Note (Signed)
Methocarbamol too expensive, switch to cyclobenzaprine as this is on the $4 list at pharmacy. Continue Meloxicam. BMP pending.

## 2016-10-14 ENCOUNTER — Other Ambulatory Visit: Payer: Self-pay | Admitting: Primary Care

## 2016-10-14 DIAGNOSIS — F411 Generalized anxiety disorder: Secondary | ICD-10-CM

## 2016-10-14 DIAGNOSIS — G4700 Insomnia, unspecified: Secondary | ICD-10-CM

## 2016-10-14 DIAGNOSIS — K219 Gastro-esophageal reflux disease without esophagitis: Secondary | ICD-10-CM

## 2016-10-14 MED ORDER — TRAZODONE HCL 50 MG PO TABS
50.0000 mg | ORAL_TABLET | Freq: Every evening | ORAL | 0 refills | Status: DC | PRN
Start: 2016-10-14 — End: 2021-11-21

## 2016-10-14 MED ORDER — OMEPRAZOLE 20 MG PO CPDR: 20.0000 mg | DELAYED_RELEASE_CAPSULE | Freq: Every day | ORAL | 1 refills | Status: DC

## 2019-01-06 ENCOUNTER — Encounter: Payer: Self-pay | Admitting: *Deleted

## 2019-01-06 ENCOUNTER — Telehealth: Payer: Self-pay | Admitting: Gastroenterology

## 2019-01-06 NOTE — Telephone Encounter (Signed)
Gastroenterology Pre-Procedure Review  Request Date: Friday 01/28/2019    [facility] Requesting Physician: Dr. Marius Ditch   PATIENT REVIEW QUESTIONS: The patient responded to the following health history questions as indicated:    1. Are you having any GI issues? no 2. Do you have a personal history of Polyps? no 3. Do you have a family history of Colon Cancer or Polyps? no 4. Diabetes Mellitus? no 5. Joint replacements in the past 12 months?no 6. Major health problems in the past 3 months?no 7. Any artificial heart valves, MVP, or defibrillator?no    MEDICATIONS & ALLERGIES:    Patient reports the following regarding taking any anticoagulation/antiplatelet therapy:   Plavix, Coumadin, Eliquis, Xarelto, Lovenox, Pradaxa, Brilinta, or Effient? no Aspirin? no   Pulmonary disease?   No  Patient confirms/reports the following medications:  Current Outpatient Medications  Medication Sig Dispense Refill  . cyclobenzaprine (FLEXERIL) 10 MG tablet Take 1 tablet (10 mg total) by mouth 2 (two) times daily as needed for muscle spasms. 180 tablet 1  . lisinopril-hydrochlorothiazide (PRINZIDE,ZESTORETIC) 10-12.5 MG tablet Take 1 tablet by mouth daily. 90 tablet 1  . lovastatin (MEVACOR) 20 MG tablet Take 1 tablet (20 mg total) by mouth at bedtime. 90 tablet 3  . meloxicam (MOBIC) 15 MG tablet Take 15 mg by mouth daily.  3  . omeprazole (PRILOSEC) 20 MG capsule Take 1 capsule (20 mg total) by mouth daily. 90 capsule 1  . PARoxetine (PAXIL) 20 MG tablet Take 2 tablets (40 mg total) by mouth daily. 180 tablet 1  . traZODone (DESYREL) 50 MG tablet Take 1 tablet (50 mg total) by mouth at bedtime as needed for sleep. 90 tablet 0   No current facility-administered medications for this visit.     Patient confirms/reports the following allergies:  No Known Allergies  No orders of the defined types were placed in this encounter.   AUTHORIZATION INFORMATION Primary Insurance: 1D#: Group #:  Secondary  Insurance: 1D#: Group #:  SCHEDULE INFORMATION: Date:  Time: Location:

## 2019-07-28 ENCOUNTER — Encounter: Payer: Self-pay | Admitting: *Deleted

## 2019-09-02 ENCOUNTER — Emergency Department: Payer: BC Managed Care – PPO

## 2019-09-02 ENCOUNTER — Encounter: Payer: Self-pay | Admitting: Emergency Medicine

## 2019-09-02 ENCOUNTER — Emergency Department
Admission: EM | Admit: 2019-09-02 | Discharge: 2019-09-02 | Disposition: A | Discharge status: Home / Self Care | Payer: BC Managed Care – PPO | Attending: Emergency Medicine | Admitting: Emergency Medicine

## 2019-09-02 ENCOUNTER — Other Ambulatory Visit: Payer: Self-pay

## 2019-09-02 DIAGNOSIS — R1031 Right lower quadrant pain: Secondary | ICD-10-CM | POA: Diagnosis not present

## 2019-09-02 DIAGNOSIS — F17210 Nicotine dependence, cigarettes, uncomplicated: Secondary | ICD-10-CM | POA: Diagnosis not present

## 2019-09-02 DIAGNOSIS — K625 Hemorrhage of anus and rectum: Secondary | ICD-10-CM

## 2019-09-02 DIAGNOSIS — I10 Essential (primary) hypertension: Secondary | ICD-10-CM | POA: Diagnosis not present

## 2019-09-02 LAB — URINALYSIS, COMPLETE (UACMP) WITH MICROSCOPIC
Bacteria, UA: NONE SEEN
Bilirubin Urine: NEGATIVE
Glucose, UA: NEGATIVE mg/dL
Hgb urine dipstick: NEGATIVE
Ketones, ur: NEGATIVE mg/dL
Leukocytes,Ua: NEGATIVE
Nitrite: NEGATIVE
Protein, ur: NEGATIVE mg/dL
Specific Gravity, Urine: 1.014 (ref 1.005–1.030)
pH: 7 (ref 5.0–8.0)

## 2019-09-02 LAB — CBC
HCT: 44.4 % (ref 36.0–46.0)
Hemoglobin: 15 g/dL (ref 12.0–15.0)
MCH: 29.6 pg (ref 26.0–34.0)
MCHC: 33.8 g/dL (ref 30.0–36.0)
MCV: 87.7 fL (ref 80.0–100.0)
Platelets: 297 10*3/uL (ref 150–400)
RBC: 5.06 MIL/uL (ref 3.87–5.11)
RDW: 13.8 % (ref 11.5–15.5)
WBC: 11.8 10*3/uL — ABNORMAL HIGH (ref 4.0–10.5)
nRBC: 0 % (ref 0.0–0.2)

## 2019-09-02 LAB — COMPREHENSIVE METABOLIC PANEL
ALT: 37 U/L (ref 0–44)
AST: 33 U/L (ref 15–41)
Albumin: 4.5 g/dL (ref 3.5–5.0)
Alkaline Phosphatase: 124 U/L (ref 38–126)
Anion gap: 9 (ref 5–15)
BUN: 6 mg/dL (ref 6–20)
CO2: 27 mmol/L (ref 22–32)
Calcium: 10 mg/dL (ref 8.9–10.3)
Chloride: 103 mmol/L (ref 98–111)
Creatinine, Ser: 0.55 mg/dL (ref 0.44–1.00)
GFR calc Af Amer: >60 mL/min (ref >60)
GFR calc non Af Amer: >60 mL/min (ref >60)
Glucose, Bld: 106 mg/dL — ABNORMAL HIGH (ref 70–99)
Potassium: 4.1 mmol/L (ref 3.5–5.1)
Sodium: 139 mmol/L (ref 135–145)
Total Bilirubin: 0.6 mg/dL (ref 0.3–1.2)
Total Protein: 8.2 g/dL — ABNORMAL HIGH (ref 6.5–8.1)

## 2019-09-02 LAB — LIPASE, BLOOD: Lipase: 30 U/L (ref 11–51)

## 2019-09-02 MED ORDER — IOHEXOL 300 MG/ML  SOLN
100.0000 mL | Freq: Once | INTRAMUSCULAR | Status: AC | PRN
  Administered 2019-09-02: 100 mL via INTRAVENOUS

## 2019-09-02 MED ORDER — MORPHINE SULFATE (PF) 4 MG/ML IV SOLN
4.0000 mg | Freq: Once | INTRAVENOUS | Status: AC
  Administered 2019-09-02: 4 mg via INTRAVENOUS
  Filled 2019-09-02: qty 1

## 2019-09-02 MED ORDER — SODIUM CHLORIDE 0.9% FLUSH: 3.0000 mL | Freq: Once | INTRAVENOUS | Status: DC

## 2019-09-02 NOTE — ED Triage Notes (Signed)
Pt presents to ED via POV with c/o bright red rectal bleeding after having a BM this morning. Pt also c/o sudden onset LLQ abdominal pain that she rates 6/10 at this time. Ambulatory to desk with steady gait, NAD noted at this time.

## 2019-09-02 NOTE — ED Provider Notes (Signed)
[facility] Emergency Department Provider Note       Time seen: ----------------------------------------- 8:54 AM on 09/02/2019 -----------------------------------------   I have reviewed the triage vital signs and the nursing notes.  HISTORY   Chief Complaint Rectal Bleeding and Abdominal Pain    HPI Haley Hester is a 49 y.o. female with a history of arthritis, GERD, hyperlipidemia, hypertension, migraines who presents to the ED for bright red rectal bleeding after having a bowel movement this morning.  Patient complains of sudden onset of left lower quadrant abdominal pain that she rates a 6 out of 10.  Past Medical History:  Diagnosis Date  . Arthritis   . GERD (gastroesophageal reflux disease)   . Hyperlipidemia   . Hypertension   . Migraines     Patient Active Problem List   Diagnosis Date Noted  . Essential hypertension 05/05/2016  . GAD (generalized anxiety disorder) 03/08/2016  . Hot flashes 03/08/2016  . Gastroesophageal reflux disease 03/08/2016  . Hyperlipidemia 03/08/2016  . Osteoarthritis 03/08/2016    Past Surgical History:  Procedure Laterality Date  . ABDOMINAL HYSTERECTOMY      Allergies Patient has no known allergies.  Social History Social History   Tobacco Use  . Smoking status: Current Every Day Smoker    Packs/day: 0.50    Types: Cigarettes  . Smokeless tobacco: Never Used  Substance Use Topics  . Alcohol use: Yes  . Drug use: Not on file    Review of Systems Constitutional: Negative for fever. Cardiovascular: Negative for chest pain. Respiratory: Negative for shortness of breath. Gastrointestinal: Positive for abdominal pain, rectal bleeding Musculoskeletal: Negative for back pain. Skin: Negative for rash. Neurological: Negative for headaches, focal weakness or numbness.  All systems negative/normal/unremarkable except as stated in the  HPI  ____________________________________________   PHYSICAL EXAM:  VITAL SIGNS: ED Triage Vitals  Enc Vitals Group     BP 09/02/19 0813 (!) 182/87     Pulse Rate 09/02/19 0813 70     Resp 09/02/19 0813 18     Temp 09/02/19 0813 98 F (36.7 C)     Temp Source 09/02/19 0813 Oral     SpO2 09/02/19 0813 97 %     Weight 09/02/19 0811 145 lb (65.8 kg)     Height 09/02/19 0811 5\' 8"  (1.727 m)     Head Circumference --      Peak Flow --      Pain Score 09/02/19 0811 6     Pain Loc --      Pain Edu? --      Excl. in [place]? --     Constitutional: Alert and oriented. Well appearing and in no distress. Eyes: Conjunctivae are normal. Normal extraocular movements. Cardiovascular: Normal rate, regular rhythm. No murmurs, rubs, or gallops. Respiratory: Normal respiratory effort without tachypnea nor retractions. Breath sounds are clear and equal bilaterally. No wheezes/rales/rhonchi. Gastrointestinal: Left lower quadrant tenderness, no rebound or guarding.  Normal bowel sounds. Rectal: No gross blood, no hemorrhoids Musculoskeletal: Nontender with normal range of motion in extremities. No lower extremity tenderness nor edema. Neurologic:  Normal speech and language. No gross focal neurologic deficits are appreciated.  Skin:  Skin is warm, dry and intact. No rash noted. Psychiatric: Mood and affect are normal. Speech and behavior are normal.  ____________________________________________  ED COURSE:  As part of my medical decision making, I reviewed the following data within the [place] History obtained from family if available, nursing notes, old  chart and ekg, as well as notes from prior ED visits. Patient presented for abdominal pain and rectal bleeding, we will assess with labs and imaging as indicated at this time.   Procedures  Haley Hester was evaluated in Emergency Department on 09/02/2019 for the symptoms described in the history of present illness. She was  evaluated in the context of the global COVID-19 pandemic, which necessitated consideration that the patient might be at risk for infection with the SARS-CoV-2 virus that causes COVID-19. Institutional protocols and algorithms that pertain to the evaluation of patients at risk for COVID-19 are in a state of rapid change based on information released by regulatory bodies including the CDC and federal and state organizations. These policies and algorithms were followed during the patient's care in the ED.  ____________________________________________   LABS (pertinent positives/negatives)  Labs Reviewed  COMPREHENSIVE METABOLIC PANEL - Abnormal; Notable for the following components:      Result Value   Glucose, Bld 106 (*)    Total Protein 8.2 (*)    All other components within normal limits  CBC - Abnormal; Notable for the following components:   WBC 11.8 (*)    All other components within normal limits  URINALYSIS, COMPLETE (UACMP) WITH MICROSCOPIC - Abnormal; Notable for the following components:   Color, Urine YELLOW (*)    APPearance HAZY (*)    All other components within normal limits  LIPASE, BLOOD    RADIOLOGY Images were viewed by me  CT of the abdomen pelvis with contrast IMPRESSION: Tiny umbilical hernia containing fat.  No acute intra-abdominal or intrapelvic abnormalities.  Aortic Atherosclerosis (ICD10-I70.0). ____________________________________________   DIFFERENTIAL DIAGNOSIS   Diverticulitis, hemorrhoids, diverticulosis, occult CA  FINAL ASSESSMENT AND PLAN  Rectal bleeding   Plan: The patient had presented for rectal bleeding and abdominal pain. Patient's labs were unremarkable. Patient's imaging not reveal any acute process.  As noted above rectally there was no blood or hemorrhoids.  Should be referred to GI for close outpatient follow-up.   Laurence Aly, MD    Note: This note was generated in part or whole with voice recognition software.  Voice recognition is usually quite accurate but there are transcription errors that can and very often do occur. I apologize for any typographical errors that were not detected and corrected.     Earleen Newport, MD 09/02/19 1122

## 2019-09-20 ENCOUNTER — Encounter: Payer: Self-pay | Admitting: Gastroenterology

## 2019-09-20 ENCOUNTER — Other Ambulatory Visit: Payer: Self-pay

## 2019-09-20 ENCOUNTER — Ambulatory Visit (INDEPENDENT_AMBULATORY_CARE_PROVIDER_SITE_OTHER): Payer: BC Managed Care – PPO | Admitting: Gastroenterology

## 2019-09-20 VITALS — BP 147/90 | HR 94 | Temp 98.1°F | Ht 68.0 in | Wt 150.5 lb

## 2019-09-20 DIAGNOSIS — K625 Hemorrhage of anus and rectum: Secondary | ICD-10-CM | POA: Diagnosis not present

## 2019-09-20 MED ORDER — NA SULFATE-K SULFATE-MG SULF 17.5-3.13-1.6 GM/177ML PO SOLN
354.0000 mL | Freq: Once | ORAL | 0 refills | Status: AC
Start: 2019-09-20 — End: 2019-09-20

## 2019-09-20 NOTE — Addendum Note (Signed)
Addended by: Ulyess Blossom L on: 09/20/2019 09:14 AM   Modules accepted: Orders

## 2019-09-20 NOTE — Progress Notes (Signed)
Cephas Darby, MD [address]  [place], [place] [address]  Main: [phone]  Fax: [phone]    Gastroenterology Consultation  Referring Provider:     Gwynne Edinger, MD Primary Care Physician:  Gwynne Edinger, MD Primary Gastroenterologist:  Dr. Cephas Darby Reason for Consultation:     Rectal bleeding        HPI:   Haley Hester is a 49 y.o. female referred by Dr. Si Raider, Ailene Rud, MD  for consultation & management of rectal bleeding.  Patient went to ER on 5/14 secondary to one episode of bright red blood per rectum associated with left lower quadrant discomfort, abdominal bloating.  Patient reports that this was a one-time episode, noticed fresh blood in the stool and on wiping.  Labs revealed normal hemoglobin, CT abdomen and pelvis was unremarkable.  She is referred to GI for further evaluation.  Patient reports that she has been experiencing left lower quadrant discomfort associated with loose stools and abdominal bloating for several years.  She acknowledges drinking 3 to 4 cans of Pepsi daily She denies any weight loss  NSAIDs: None  Antiplts/Anticoagulants/Anti thrombotics: None  GI Procedures: None She denies family history of GI malignancy  Past Medical History:  Diagnosis Date   Arthritis    GERD (gastroesophageal reflux disease)    Hyperlipidemia    Hypertension    Migraines     Past Surgical History:  Procedure Laterality Date   ABDOMINAL HYSTERECTOMY      Current Outpatient Medications:    acetaminophen (TYLENOL) 325 MG tablet, Take 650 mg by mouth every 6 (six) hours as needed., Disp: , Rfl:    atorvastatin (LIPITOR) 20 MG tablet, Take 20 mg by mouth daily., Disp: , Rfl:    Multiple Vitamin (MULTI-VITAMIN) tablet, Take by mouth., Disp: , Rfl:    omeprazole (PRILOSEC) 20 MG capsule, Take 1 capsule (20 mg total) by mouth daily., Disp: 90 capsule, Rfl: 1   traZODone (DESYREL) 50 MG tablet, Take 1 tablet (50  mg total) by mouth at bedtime as needed for sleep., Disp: 90 tablet, Rfl: 0   venlafaxine (EFFEXOR) 37.5 MG tablet, Take 37.5 mg by mouth daily. Take 2 tablets daily for 1 week then increased to 4 tablets daily, Disp: , Rfl:    History reviewed. No pertinent family history.   Social History   Tobacco Use   Smoking status: Current Every Day Smoker    Packs/day: 0.50    Types: Cigarettes   Smokeless tobacco: Never Used  Substance Use Topics   Alcohol use: Yes    Comment: occ   Drug use: Not on file    Allergies as of 09/20/2019   (No Known Allergies)    Review of Systems:    All systems reviewed and negative except where noted in HPI.   Physical Exam:  BP (!) 147/90 (BP Location: Left Arm, Patient Position: Sitting, Cuff Size: Normal)    Pulse 94    Temp 98.1 F (36.7 C) (Oral)    Ht 5\' 8"  (1.727 m)    Wt 150 lb 8 oz (68.3 kg)    BMI 22.88 kg/m  No LMP recorded. Patient has had a hysterectomy.  General:   Alert,  Well-developed, well-nourished, pleasant and cooperative in NAD Head:  Normocephalic and atraumatic. Eyes:  Sclera clear, no icterus.   Conjunctiva pink. Ears:  Normal auditory acuity. Nose:  No deformity, discharge, or lesions. Mouth:  No deformity or lesions,oropharynx  pink & moist. Neck:  Supple; no masses or thyromegaly. Lungs:  Respirations even and unlabored.  Clear throughout to auscultation.   No wheezes, crackles, or rhonchi. No acute distress. Heart:  Regular rate and rhythm; no murmurs, clicks, rubs, or gallops. Abdomen:  Normal bowel sounds. Soft, left lower quadrant tenderness and moderately distended without masses, hepatosplenomegaly or hernias noted.  No guarding or rebound tenderness.   Rectal: Not performed Msk:  Symmetrical without gross deformities. Good, equal movement & strength bilaterally. Pulses:  Normal pulses noted. Extremities:  No clubbing or edema.  No cyanosis. Neurologic:  Alert and oriented x3;  grossly normal  neurologically. Skin:  Intact without significant lesions or rashes. No jaundice. Psych:  Alert and cooperative. Normal mood and affect.  Imaging Studies: Reviewed  Assessment and Plan:   Haley Hester is a 49 y.o. female with chronic tobacco use is seen in consultation for bright red blood per rectum, left lower quadrant discomfort and abdominal bloating with loose stools  Left lower quadrant tenderness, abdominal bloating with loose stools: Likely osmotic diarrhea secondary to heavy intake of carbonated beverages Strongly advised to stop consumption of carbonated beverages, fruit juices, artificial sweeteners Colonoscopy with biopsies  Rectal bleeding Recommend diagnostic colonoscopy given her age    Follow up in 2 to 3 months   Cephas Darby, MD

## 2019-09-29 ENCOUNTER — Other Ambulatory Visit
Admission: RE | Admit: 2019-09-29 | Discharge: 2019-09-29 | Disposition: A | Discharge status: Home / Self Care | Payer: BC Managed Care – PPO | Source: Ambulatory Visit | Attending: Gastroenterology | Admitting: Gastroenterology

## 2019-09-29 ENCOUNTER — Other Ambulatory Visit: Payer: Self-pay

## 2019-09-29 DIAGNOSIS — Z01812 Encounter for preprocedural laboratory examination: Secondary | ICD-10-CM | POA: Insufficient documentation

## 2019-09-29 DIAGNOSIS — Z20822 Contact with and (suspected) exposure to covid-19: Secondary | ICD-10-CM | POA: Diagnosis not present

## 2019-09-30 LAB — SARS CORONAVIRUS 2 (TAT 6-24 HRS): SARS Coronavirus 2: NEGATIVE

## 2019-10-03 ENCOUNTER — Encounter
Admission: RE | Disposition: A | Discharge status: Home / Self Care | Payer: Self-pay | Source: Home / Self Care | Attending: Gastroenterology

## 2019-10-03 ENCOUNTER — Ambulatory Visit: Payer: BC Managed Care – PPO | Admitting: Registered Nurse

## 2019-10-03 ENCOUNTER — Other Ambulatory Visit: Payer: Self-pay

## 2019-10-03 ENCOUNTER — Ambulatory Visit
Admission: RE | Admit: 2019-10-03 | Discharge: 2019-10-03 | Disposition: A | Discharge status: Home / Self Care | Payer: BC Managed Care – PPO | Attending: Gastroenterology | Admitting: Gastroenterology

## 2019-10-03 ENCOUNTER — Encounter: Payer: Self-pay | Admitting: Gastroenterology

## 2019-10-03 DIAGNOSIS — M1990 Unspecified osteoarthritis, unspecified site: Secondary | ICD-10-CM | POA: Insufficient documentation

## 2019-10-03 DIAGNOSIS — K625 Hemorrhage of anus and rectum: Secondary | ICD-10-CM | POA: Diagnosis not present

## 2019-10-03 DIAGNOSIS — G43909 Migraine, unspecified, not intractable, without status migrainosus: Secondary | ICD-10-CM | POA: Insufficient documentation

## 2019-10-03 DIAGNOSIS — K219 Gastro-esophageal reflux disease without esophagitis: Secondary | ICD-10-CM | POA: Insufficient documentation

## 2019-10-03 DIAGNOSIS — K5730 Diverticulosis of large intestine without perforation or abscess without bleeding: Secondary | ICD-10-CM | POA: Diagnosis not present

## 2019-10-03 DIAGNOSIS — I10 Essential (primary) hypertension: Secondary | ICD-10-CM | POA: Insufficient documentation

## 2019-10-03 DIAGNOSIS — R1032 Left lower quadrant pain: Secondary | ICD-10-CM

## 2019-10-03 DIAGNOSIS — F17210 Nicotine dependence, cigarettes, uncomplicated: Secondary | ICD-10-CM | POA: Diagnosis not present

## 2019-10-03 DIAGNOSIS — E785 Hyperlipidemia, unspecified: Secondary | ICD-10-CM | POA: Insufficient documentation

## 2019-10-03 DIAGNOSIS — D122 Benign neoplasm of ascending colon: Secondary | ICD-10-CM | POA: Insufficient documentation

## 2019-10-03 DIAGNOSIS — D123 Benign neoplasm of transverse colon: Secondary | ICD-10-CM | POA: Diagnosis not present

## 2019-10-03 DIAGNOSIS — Z79899 Other long term (current) drug therapy: Secondary | ICD-10-CM | POA: Insufficient documentation

## 2019-10-03 DIAGNOSIS — K635 Polyp of colon: Secondary | ICD-10-CM

## 2019-10-03 HISTORY — PX: COLONOSCOPY WITH PROPOFOL: SHX5780

## 2019-10-03 SURGERY — COLONOSCOPY WITH PROPOFOL
Anesthesia: General

## 2019-10-03 MED ORDER — PROPOFOL 500 MG/50ML IV EMUL
INTRAVENOUS | Status: DC | PRN
  Administered 2019-10-03: 150 ug/kg/min via INTRAVENOUS

## 2019-10-03 MED ORDER — LABETALOL HCL 5 MG/ML IV SOLN
INTRAVENOUS | Status: DC | PRN
  Administered 2019-10-03: 10 mg via INTRAVENOUS
  Administered 2019-10-03: 5 mg via INTRAVENOUS

## 2019-10-03 MED ORDER — LIDOCAINE HCL (CARDIAC) PF 100 MG/5ML IV SOSY
PREFILLED_SYRINGE | INTRAVENOUS | Status: DC | PRN
  Administered 2019-10-03: 80 mg via INTRAVENOUS
  Administered 2019-10-03: 20 mg via INTRAVENOUS

## 2019-10-03 MED ORDER — DICYCLOMINE HCL 10 MG PO CAPS: 10.0000 mg | ORAL_CAPSULE | Freq: Three times a day (TID) | ORAL | 0 refills | Status: DC

## 2019-10-03 MED ORDER — PROPOFOL 10 MG/ML IV BOLUS
INTRAVENOUS | Status: DC | PRN
  Administered 2019-10-03: 60 mg via INTRAVENOUS
  Administered 2019-10-03: 30 mg via INTRAVENOUS
  Administered 2019-10-03: 10 mg via INTRAVENOUS

## 2019-10-03 MED ORDER — DEXMEDETOMIDINE HCL 200 MCG/2ML IV SOLN
INTRAVENOUS | Status: DC | PRN
  Administered 2019-10-03: 12 ug via INTRAVENOUS

## 2019-10-03 MED ORDER — SODIUM CHLORIDE 0.9 % IV SOLN: INTRAVENOUS | Status: DC

## 2019-10-03 NOTE — Anesthesia Postprocedure Evaluation (Signed)
Anesthesia Post Note  Patient: Haley Hester  Procedure(s) Performed: COLONOSCOPY WITH PROPOFOL (N/A )  Patient location during evaluation: Endoscopy Anesthesia Type: General Level of consciousness: awake and alert Pain management: pain level controlled Vital Signs Assessment: post-procedure vital signs reviewed and stable Respiratory status: spontaneous breathing and respiratory function stable Cardiovascular status: stable Anesthetic complications: no   No complications documented.   Last Vitals:  Vitals:   10/03/19 1045 10/03/19 1234  BP: (!) 169/99 97/61  Pulse: 90 75  Resp: 20 (!) 27  Temp: (!) 36.2 C 36.6 C  SpO2: 99% 91%    Last Pain:  Vitals:   10/03/19 1234  TempSrc: Temporal  PainSc: Asleep                 Kindall Swaby K

## 2019-10-03 NOTE — Op Note (Signed)
[facility] Gastroenterology Patient Name: Haley Hester Procedure Date: 10/03/2019 11:47 AM MRN: [id number] Account #: [id number] Date of Birth: 02-05-1971 Admit Type: Outpatient Age: 49 Room: [facility] ENDO ROOM 1 Gender: Female Note Status: Finalized Procedure:             Colonoscopy Indications:           Abdominal pain in the left lower quadrant, Rectal                         bleeding Providers:             Lin Landsman MD, MD Medicines:             Monitored Anesthesia Care Complications:         No immediate complications. Estimated blood loss: None. Procedure:             Pre-Anesthesia Assessment:                        - Prior to the procedure, a History and Physical was                         performed, and patient medications and allergies were                         reviewed. The patient is competent. The risks and                         benefits of the procedure and the sedation options and                         risks were discussed with the patient. All questions                         were answered and informed consent was obtained.                         Patient identification and proposed procedure were                         verified by the physician, the nurse, the                         anesthesiologist, the anesthetist and the technician                         in the pre-procedure area in the procedure room in the                         endoscopy suite. Mental Status Examination: alert and                         oriented. Airway Examination: normal oropharyngeal                         airway and neck mobility. Respiratory Examination:                         clear to auscultation. CV Examination: normal.  Prophylactic Antibiotics: The patient does not require                         prophylactic antibiotics. Prior Anticoagulants: The                         patient has taken no previous anticoagulant or                          antiplatelet agents. ASA Grade Assessment: II - A                         patient with mild systemic disease. After reviewing                         the risks and benefits, the patient was deemed in                         satisfactory condition to undergo the procedure. The                         anesthesia plan was to use monitored anesthesia care                         (MAC). Immediately prior to administration of                         medications, the patient was re-assessed for adequacy                         to receive sedatives. The heart rate, respiratory                         rate, oxygen saturations, blood pressure, adequacy of                         pulmonary ventilation, and response to care were                         monitored throughout the procedure. The physical                         status of the patient was re-assessed after the                         procedure.                        After obtaining informed consent, the colonoscope was                         passed under direct vision. Throughout the procedure,                         the patient's blood pressure, pulse, and oxygen                         saturations were monitored continuously. The  Colonoscope was introduced through the anus and                         advanced to the the terminal ileum, with                         identification of the appendiceal orifice and IC                         valve. The colonoscopy was performed without                         difficulty. The patient tolerated the procedure well.                         The quality of the bowel preparation was evaluated                         using the BBPS [facility] Bowel Preparation Scale) with                         scores of: Right Colon = 3, Transverse Colon = 3 and                         Left Colon = 3 (entire mucosa seen well with no                         residual  staining, small fragments of stool or opaque                         liquid). The total BBPS score equals 9. Findings:      The perianal and digital rectal examinations were normal. Pertinent       negatives include normal sphincter tone and no palpable rectal lesions.      A 5 mm polyp was found in the ascending colon. The polyp was sessile.       The polyp was removed with a cold snare. Resection and retrieval were       complete.      A 3 mm polyp was found in the transverse colon. The polyp was sessile.       The polyp was removed with a cold biopsy forceps. Resection and       retrieval were complete.      Normal mucosa was found in the entire colon. Biopsies for histology were       taken with a cold forceps from the entire colon for evaluation of       microscopic colitis.      Multiple diverticula were found in the sigmoid colon.      The terminal ileum appeared normal. Impression:            - One 5 mm polyp in the ascending colon, removed with                         a cold snare. Resected and retrieved.                        - One 3 mm polyp in the transverse colon, removed with  a cold biopsy forceps. Resected and retrieved.                        - Normal mucosa in the entire examined colon. Biopsied.                        - Diverticulosis in the sigmoid colon. Recommendation:        - Discharge patient to home (with escort).                        - Resume previous diet today.                        - Continue present medications.                        - Await pathology results.                        - Repeat colonoscopy in 7 years for surveillance.                        - Return to my office as previously scheduled. Procedure Code(s):     --- Professional ---                        [phone], Colonoscopy, flexible; with removal of                         tumor(s), polyp(s), or other lesion(s) by snare                         technique                         45380, 83, Colonoscopy, flexible; with biopsy, single                         or multiple Diagnosis Code(s):     --- Professional ---                        K63.5, Polyp of colon                        R10.32, Left lower quadrant pain                        K62.5, Hemorrhage of anus and rectum                        K57.30, Diverticulosis of large intestine without                         perforation or abscess without bleeding CPT copyright 2019 American Medical Association. All rights reserved. The codes documented in this report are preliminary and upon coder review may  be revised to meet current compliance requirements. Dr. Ulyess Mort Lin Landsman MD, MD 10/03/2019 12:32:08 PM This report has been signed electronically. Number of Addenda: 0 Note Initiated On: 10/03/2019 11:47 AM Scope Withdrawal Time: 0 hours 13 minutes 1 second  Total Procedure Duration: 0  hours 21 minutes 1 second  Estimated Blood Loss:  Estimated blood loss: none.      [facility]

## 2019-10-03 NOTE — H&P (Signed)
Haley Darby, MD [address]  [place]  [place],  [address]  Main: [phone]  Fax: [phone] Pager: [phone]  Primary Care Physician:  Haley Edinger, MD Primary Gastroenterologist:  Dr. Cephas Hester  Pre-Procedure History & Physical: HPI:  Haley Hester is a 49 y.o. female is here for an colonoscopy.   Past Medical History:  Diagnosis Date  . Arthritis   . GERD (gastroesophageal reflux disease)   . Hyperlipidemia   . Hypertension   . Migraines     Past Surgical History:  Procedure Laterality Date  . ABDOMINAL HYSTERECTOMY     partcial   . COLONOSCOPY WITH ESOPHAGOGASTRODUODENOSCOPY (EGD)      Prior to Admission medications   Medication Sig Start Date End Date Taking? Authorizing Provider  acetaminophen (TYLENOL) 325 MG tablet Take 650 mg by mouth every 6 (six) hours as needed.    [provider]  atorvastatin (LIPITOR) 20 MG tablet Take 20 mg by mouth daily. 08/25/19   [provider]  Multiple Vitamin (MULTI-VITAMIN) tablet Take by mouth.    [provider]  omeprazole (PRILOSEC) 20 MG capsule Take 1 capsule (20 mg total) by mouth daily. 10/14/16   Haley Koch, NP  traZODone (DESYREL) 50 MG tablet Take 1 tablet (50 mg total) by mouth at bedtime as needed for sleep. 10/14/16   Haley Koch, NP  venlafaxine (EFFEXOR) 37.5 MG tablet Take 37.5 mg by mouth daily. Take 2 tablets daily for 1 week then increased to 4 tablets daily    [provider]    Allergies as of 09/20/2019  . (No Known Allergies)    History reviewed. No pertinent family history.  Social History   Socioeconomic History  . Marital status: Single    Spouse name: Not on file  . Number of children: Not on file  . Years of education: Not on file  . Highest education level: Not on file  Occupational History  . Not on file  Tobacco Use  . Smoking status: Current Every Day Smoker    Packs/day: 0.50    Types: Cigarettes    . Smokeless tobacco: Former Systems developer    Types: Secondary school teacher  . Vaping Use: Never used  Substance and Sexual Activity  . Alcohol use: Yes    Comment: occ  . Drug use: Not Currently  . Sexual activity: Not on file  Other Topics Concern  . Not on file  Social History Narrative  . Not on file   Social Determinants of Health   Financial Resource Strain:   . Difficulty of Paying Living Expenses:   Food Insecurity:   . Worried About Charity fundraiser in the Last Year:   . Arboriculturist in the Last Year:   Transportation Needs:   . Film/video editor (Medical):   Marland Kitchen Lack of Transportation (Non-Medical):   Physical Activity:   . Days of Exercise per Week:   . Minutes of Exercise per Session:   Stress:   . Feeling of Stress :   Social Connections:   . Frequency of Communication with Friends and Family:   . Frequency of Social Gatherings with Friends and Family:   . Attends Religious Services:   . Active Member of Clubs or Organizations:   . Attends Archivist Meetings:   Marland Kitchen Marital Status:   Intimate Partner Violence:   . Fear of Current or Ex-Partner:   . Emotionally Abused:   .  Physically Abused:   . Sexually Abused:     Review of Systems: See HPI, otherwise negative ROS  Physical Exam: BP (!) 169/99   Pulse 90   Temp (!) 97.2 F (36.2 C) (Temporal)   Resp 20   Ht 5\' 8"  (1.727 m)   Wt 68 kg   SpO2 99%   BMI 22.81 kg/m  General:   Alert,  pleasant and cooperative in NAD Head:  Normocephalic and atraumatic. Neck:  Supple; no masses or thyromegaly. Lungs:  Clear throughout to auscultation.    Heart:  Regular rate and rhythm. Abdomen:  Soft, nontender and nondistended. Normal bowel sounds, without guarding, and without rebound.   Neurologic:  Alert and  oriented x4;  grossly normal neurologically.  Impression/Plan: Haley Hester is here for an colonoscopy to be performed for bright red blood per rectum, left lower quadrant discomfort and  abdominal bloating with loose stools  Risks, benefits, limitations, and alternatives regarding  colonoscopy have been reviewed with the patient.  Questions have been answered.  All parties agreeable.   Haley Sear, MD  10/03/2019, 11:47 AM

## 2019-10-03 NOTE — Transfer of Care (Signed)
Immediate Anesthesia Transfer of Care Note  Patient: Haley Hester  Procedure(s) Performed: COLONOSCOPY WITH PROPOFOL (N/A )  Patient Location: Endoscopy Unit  Anesthesia Type:General  Level of Consciousness: drowsy  Airway & Oxygen Therapy: Patient Spontanous Breathing  Post-op Assessment: Report given to RN and Post -op Vital signs reviewed and stable  Post vital signs: Reviewed and stable  Last Vitals:  Vitals Value Taken Time  BP 169/99 10/03/19 1045  Temp 36.2 C 10/03/19 1045  Pulse 75 10/03/19 1234  Resp 27 10/03/19 1234  SpO2 91 % 10/03/19 1234  Vitals shown include unvalidated device data.  Last Pain:  Vitals:   10/03/19 1234  TempSrc: (P) Temporal  PainSc:          Complications: No complications documented.

## 2019-10-03 NOTE — Anesthesia Preprocedure Evaluation (Signed)
Anesthesia Evaluation  Patient identified by MRN, date of birth, ID band Patient awake    Reviewed: Allergy & Precautions, NPO status , Patient's Chart, lab work & pertinent test results  History of Anesthesia Complications Negative for: history of anesthetic complications  Airway Mallampati: II       Dental   Pulmonary neg sleep apnea, neg COPD, Current Smoker,           Cardiovascular (-) hypertension(-) Past MI and (-) CHF      Neuro/Psych neg Seizures Anxiety    GI/Hepatic Neg liver ROS, GERD  Medicated,  Endo/Other  neg diabetes  Renal/GU negative Renal ROS     Musculoskeletal   Abdominal   Peds  Hematology   Anesthesia Other Findings   Reproductive/Obstetrics                             Anesthesia Physical Anesthesia Plan  ASA: II  Anesthesia Plan: General   Post-op Pain Management:    Induction: Intravenous  PONV Risk Score and Plan: 2 and Propofol infusion and TIVA  Airway Management Planned: Nasal Cannula  Additional Equipment:   Intra-op Plan:   Post-operative Plan:   Informed Consent: I have reviewed the patients History and Physical, chart, labs and discussed the procedure including the risks, benefits and alternatives for the proposed anesthesia with the patient or authorized representative who has indicated his/her understanding and acceptance.       Plan Discussed with:   Anesthesia Plan Comments:         Anesthesia Quick Evaluation

## 2019-10-04 ENCOUNTER — Telehealth: Payer: Self-pay

## 2019-10-04 ENCOUNTER — Encounter: Payer: Self-pay | Admitting: Gastroenterology

## 2019-10-04 LAB — SURGICAL PATHOLOGY

## 2019-10-04 NOTE — Telephone Encounter (Signed)
-----   Message from Lin Landsman, MD sent at 10/04/2019  4:10 PM EDT ----- The polyps that were removed from recent colonoscopy came back benign.  Pathology results are otherwise normal.  She likely has irritable bowel syndrome. If she is still having diarrhea, recommend trial of Bentyl 10 mg before each meal and at bedtime, stop all the beverages  I will see her for follow-up as scheduled  Rohini Vanga

## 2019-10-04 NOTE — Telephone Encounter (Signed)
Called and left a message for call back  

## 2019-10-05 NOTE — Telephone Encounter (Signed)
Patient verbalized understanding and states she is not having diarrhea anymore at this time

## 2019-10-18 ENCOUNTER — Encounter: Payer: Self-pay | Admitting: Gastroenterology

## 2019-12-02 ENCOUNTER — Ambulatory Visit: Payer: BC Managed Care – PPO | Admitting: Gastroenterology

## 2019-12-15 ENCOUNTER — Other Ambulatory Visit: Payer: Self-pay

## 2019-12-16 ENCOUNTER — Ambulatory Visit: Payer: BC Managed Care – PPO | Admitting: Gastroenterology

## 2021-02-18 ENCOUNTER — Telehealth: Payer: Self-pay | Admitting: Primary Care

## 2021-02-18 NOTE — Telephone Encounter (Signed)
To clarify that she did not want to drive to [facility] to see [place] or the Behavioral health urgent care?

## 2021-02-18 NOTE — Telephone Encounter (Signed)
Noted. Thanks.

## 2021-02-18 NOTE — Telephone Encounter (Signed)
Will be more than happy to see her in office. IF she starts having thoughts of harming her self or others OR she is seeing and hearing things other are not seeing she need to go to ED for immediate evaluation.  also has a Product manager urgent care [facility]) or she can call "48" which is the 911 for mental health emergencies

## 2021-02-18 NOTE — Telephone Encounter (Signed)
Yes. Patient stated that she lives in [place] and is going to try and get a provider in her area to see her. Patient was given information on ED and [place]. Patient stated that she needs to get started back on her medications that she has been out of. Patient stated that she has medicaid and has a Product/process development scientist and going to reach out to her to see if she can help her find a provider in [place] to see. Patient stated that she does not want to drive to [facility] and she will call back if she changes her mind.

## 2021-02-18 NOTE — Telephone Encounter (Signed)
Pt called stating that she is very depressed. Pt states that she doesn't feel like getting out of bed or taking a shower. Pt states that she doesn't have thoughts of suicide. Pt was a pt of [facility] in 2018. Please advise.

## 2021-02-18 NOTE — Telephone Encounter (Signed)
Patient notified as instructed by telephone and verbalized understanding. Patient denies thoughts of harming herself or others. Patient stated that she lost her job about a year ago and has been out of medications since losing her job and insurance. Patient stated that she is now on medicaid. Patient stated that she does not think that she wants to drive to [facility] and will try to work something else out to see someone closer to where she lives. Patient is going to reach out to her case worker and see if she has any recommendations of someone that she can see closer to her.  Patient was advised that I am not sure that we are taking new medicaid patients.  Patient stated that she is going to try and work something else out. Patient was given ER precautions and she verbalized understanding.

## 2021-02-18 NOTE — Telephone Encounter (Signed)
Matt, Can patient be worked in with you tomorrow? Possible TOC.

## 2021-02-22 DIAGNOSIS — Z0000 Encounter for general adult medical examination without abnormal findings: Secondary | ICD-10-CM | POA: Diagnosis not present

## 2021-02-22 DIAGNOSIS — Z0130 Encounter for examination of blood pressure without abnormal findings: Secondary | ICD-10-CM | POA: Diagnosis not present

## 2021-02-22 DIAGNOSIS — Z23 Encounter for immunization: Secondary | ICD-10-CM | POA: Diagnosis not present

## 2021-02-22 DIAGNOSIS — F17210 Nicotine dependence, cigarettes, uncomplicated: Secondary | ICD-10-CM | POA: Diagnosis not present

## 2021-02-22 DIAGNOSIS — G4700 Insomnia, unspecified: Secondary | ICD-10-CM | POA: Diagnosis not present

## 2021-02-22 DIAGNOSIS — F411 Generalized anxiety disorder: Secondary | ICD-10-CM | POA: Diagnosis not present

## 2021-02-22 DIAGNOSIS — Z1389 Encounter for screening for other disorder: Secondary | ICD-10-CM | POA: Diagnosis not present

## 2021-02-26 ENCOUNTER — Other Ambulatory Visit: Payer: Self-pay | Admitting: Family Medicine

## 2021-02-26 DIAGNOSIS — Z1231 Encounter for screening mammogram for malignant neoplasm of breast: Secondary | ICD-10-CM

## 2021-03-15 IMAGING — CT CT ABD-PELV W/ CM
2 of 5 series · 16 of 46 positions shown, 18 images · IV contrast (APPLIED)
Comparison: None

CLINICAL DATA: Bright red rectal bleeding after bowel movement this
morning, sudden onset of LEFT lower quadrant pain rated at [DATE],
history GERD, hypertension, smoker

EXAM:
CT ABDOMEN AND PELVIS WITH CONTRAST
TECHNIQUE: Multidetector CT imaging of the abdomen and pelvis was performed
using the standard protocol following bolus administration of
intravenous contrast. Sagittal and coronal MPR images reconstructed
from axial data set.
CONTRAST:  100mL OMNIPAQUE IOHEXOL 300 MG/ML SOLN IV. No oral
contrast.

[Series 2: axial st · axial · 0.73mm/px · z∈[-427,-52]mm · 13 of 85 slices shown, 15 images]
[im 5/85  soft-tissue]
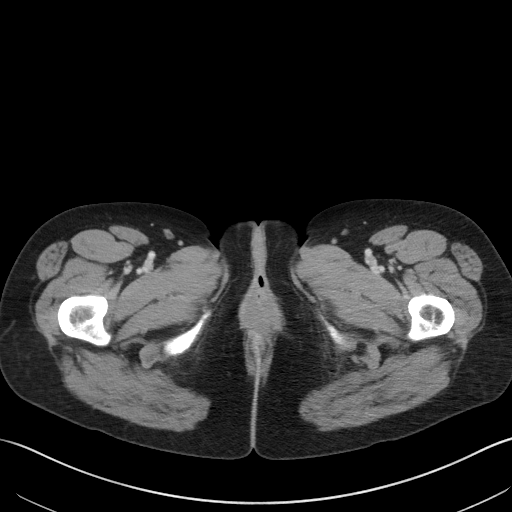
[im 5/85  bone]
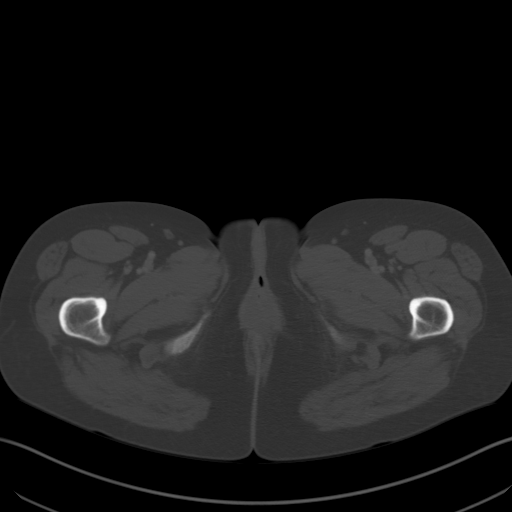
[im 14/85  soft-tissue]
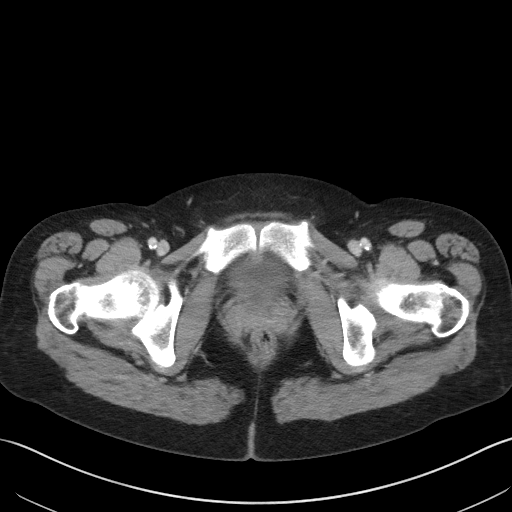
[im 18/85  soft-tissue]
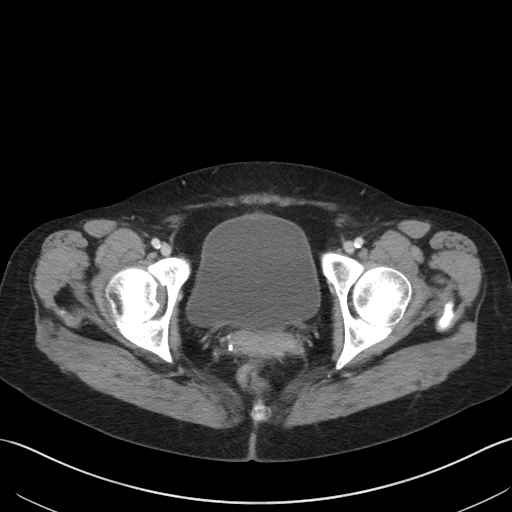
[im 23/85  soft-tissue]
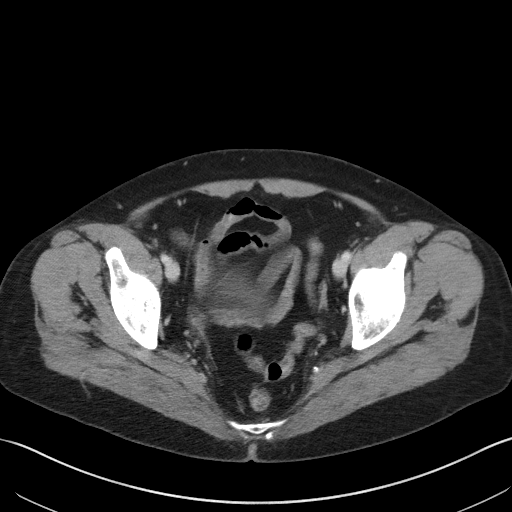
[im 31/85  soft-tissue]
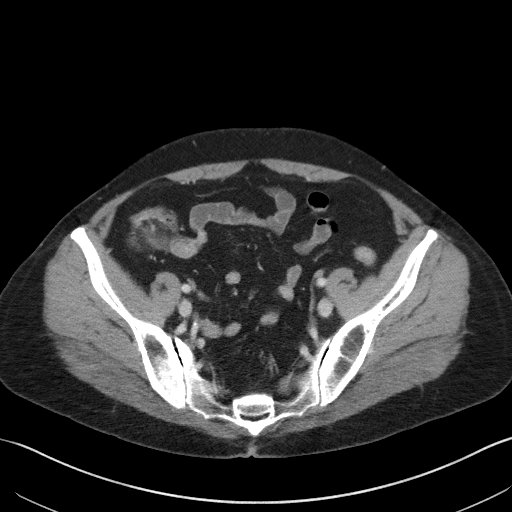
[im 36/85  soft-tissue]
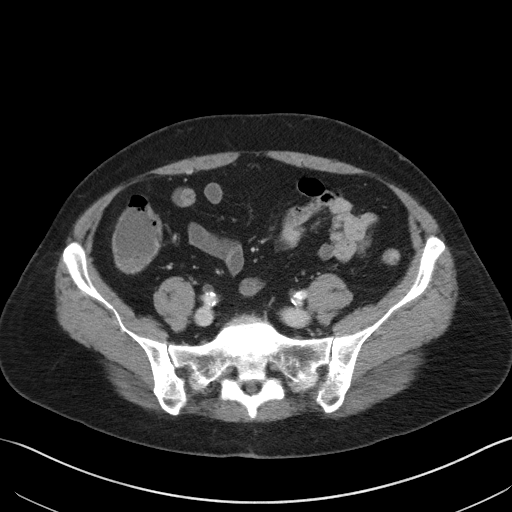
[im 45/85  soft-tissue]
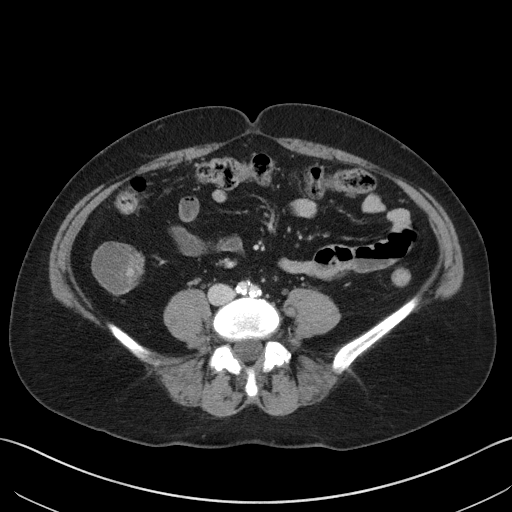
[im 49/85  soft-tissue]
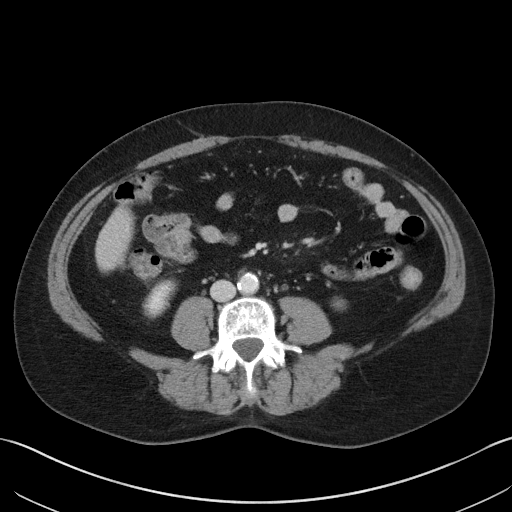
[im 54/85  soft-tissue]
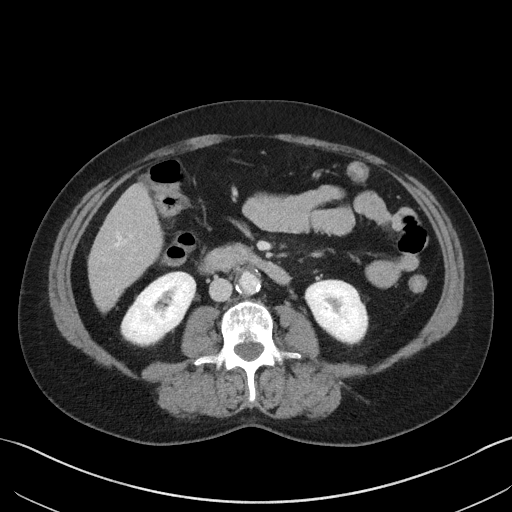
[im 54/85  bone]
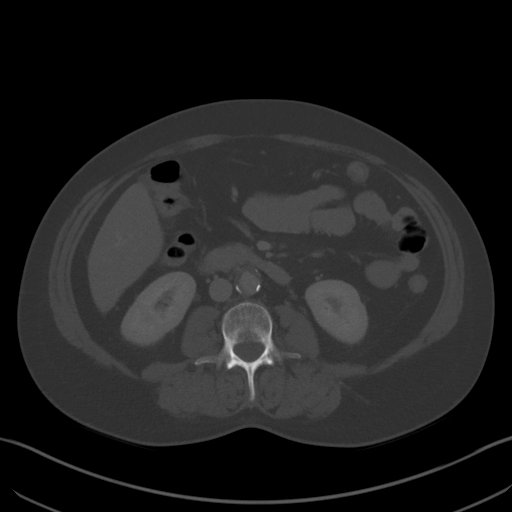
[im 62/85  soft-tissue]
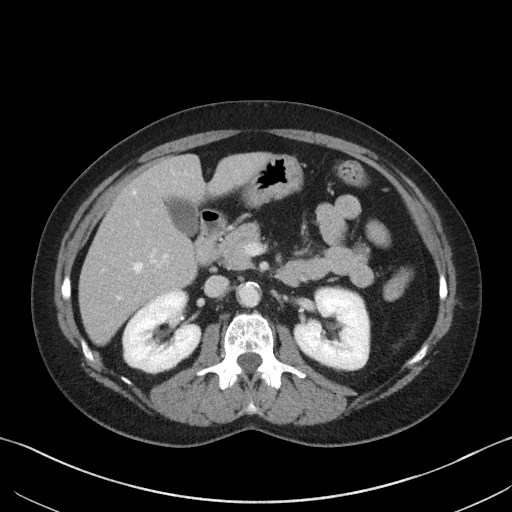
[im 67/85  soft-tissue]
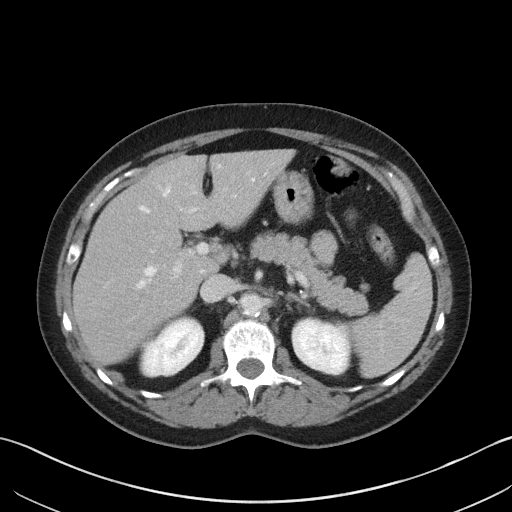
[im 71/85  soft-tissue]
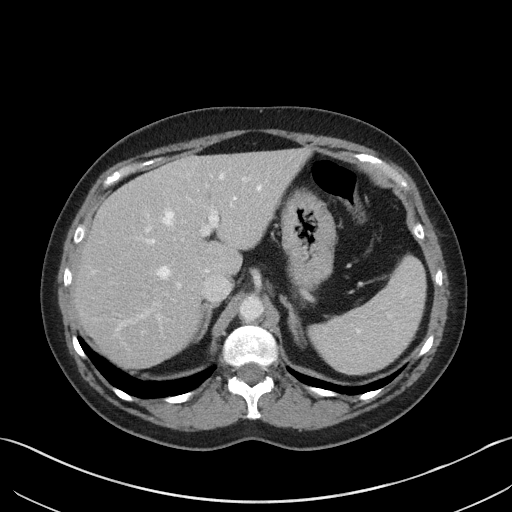
[im 80/85  soft-tissue]
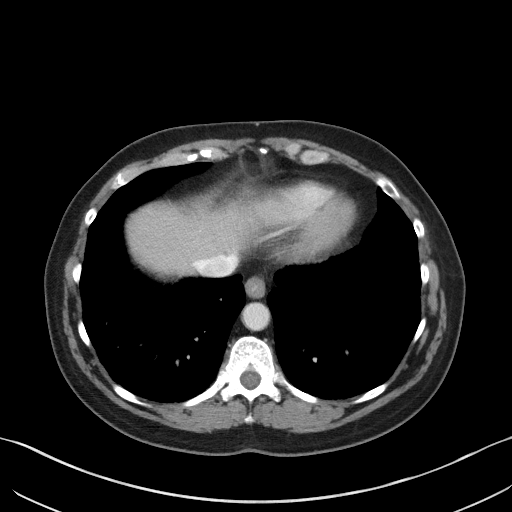

[Series 6: coronal st · coronal · 0.72mm/px · 3 of 89 slices shown]
[im 30/89  soft-tissue]
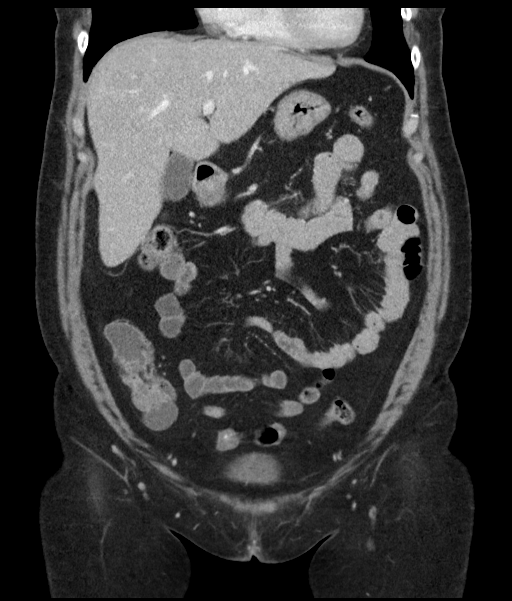
[im 40/89  soft-tissue]
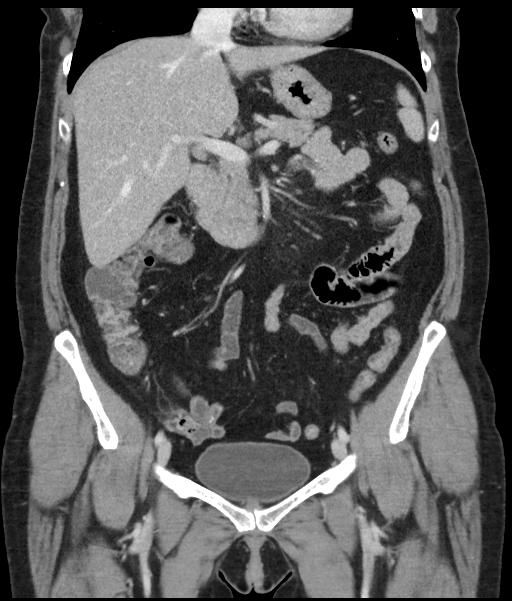
[im 49/89  soft-tissue]
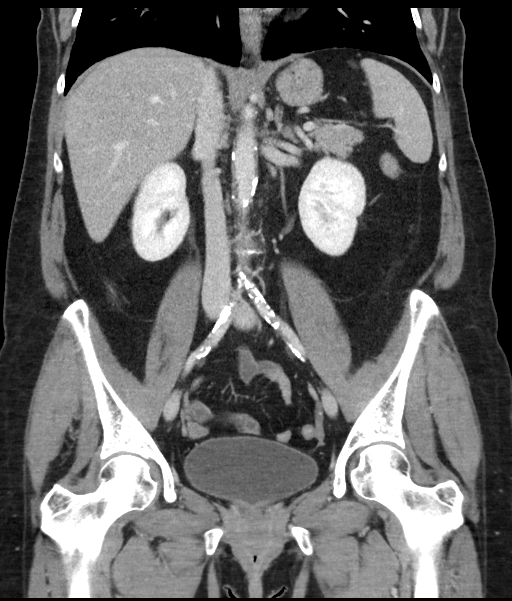

[16 of 46 positions shown; findings below may reference images not displayed]

FINDINGS: Lower chest: Lung bases clear

Hepatobiliary: Gallbladder and liver normal appearance. No biliary
dilatation.

Pancreas: Normal appearance

Spleen: Normal appearance. Probable small splenule adjacent to the
pancreatic tail.

Adrenals/Urinary Tract: Adrenal glands, kidneys, ureters, and
bladder normal appearance

Stomach/Bowel: Normal appendix. Stomach and small bowel loops normal
appearance. Colon normal appearance. No colonic diverticula, wall
thickening or pericolic inflammatory changes.

Vascular/Lymphatic: Atherosclerotic calcifications aorta and iliac
arteries without aneurysm. No adenopathy.

Reproductive: Uterus surgically absent. Atrophic ovaries
bilaterally.

Other: No free air or free fluid. Tiny umbilical hernia containing
fat. No inflammatory process.

Musculoskeletal: Unremarkable
IMPRESSION: Tiny umbilical hernia containing fat.

No acute intra-abdominal or intrapelvic abnormalities.

Aortic Atherosclerosis ([id number].M).

## 2021-11-07 ENCOUNTER — Other Ambulatory Visit: Payer: Self-pay | Admitting: Ophthalmology

## 2021-11-07 DIAGNOSIS — G43119 Migraine with aura, intractable, without status migrainosus: Secondary | ICD-10-CM

## 2021-11-18 ENCOUNTER — Ambulatory Visit
Admission: RE | Admit: 2021-11-18 | Discharge: 2021-11-18 | Disposition: A | Discharge status: Home / Self Care | Payer: BC Managed Care – PPO | Source: Ambulatory Visit | Attending: Ophthalmology | Admitting: Ophthalmology

## 2021-11-18 DIAGNOSIS — G43119 Migraine with aura, intractable, without status migrainosus: Secondary | ICD-10-CM | POA: Diagnosis not present

## 2021-11-18 DIAGNOSIS — G43909 Migraine, unspecified, not intractable, without status migrainosus: Secondary | ICD-10-CM | POA: Diagnosis not present

## 2021-11-18 DIAGNOSIS — G9389 Other specified disorders of brain: Secondary | ICD-10-CM | POA: Diagnosis not present

## 2021-11-18 MED ORDER — GADOBUTROL 1 MMOL/ML IV SOLN
6.0000 mL | Freq: Once | INTRAVENOUS | Status: AC | PRN
  Administered 2021-11-18: 6 mL via INTRAVENOUS

## 2021-11-21 ENCOUNTER — Encounter: Payer: Self-pay | Admitting: Family

## 2021-11-21 ENCOUNTER — Other Ambulatory Visit: Payer: Self-pay | Admitting: Family

## 2021-11-21 ENCOUNTER — Ambulatory Visit: Payer: BC Managed Care – PPO | Admitting: Family

## 2021-11-21 ENCOUNTER — Ambulatory Visit (INDEPENDENT_AMBULATORY_CARE_PROVIDER_SITE_OTHER)
Admission: RE | Admit: 2021-11-21 | Discharge: 2021-11-21 | Disposition: A | Discharge status: Home / Self Care | Payer: BC Managed Care – PPO | Source: Ambulatory Visit | Attending: Family | Admitting: Family

## 2021-11-21 VITALS — BP 142/86 | HR 85 | Temp 98.7°F | Resp 16 | Ht 68.0 in | Wt 131.1 lb

## 2021-11-21 DIAGNOSIS — R062 Wheezing: Secondary | ICD-10-CM | POA: Diagnosis not present

## 2021-11-21 DIAGNOSIS — R0609 Other forms of dyspnea: Secondary | ICD-10-CM

## 2021-11-21 DIAGNOSIS — K5790 Diverticulosis of intestine, part unspecified, without perforation or abscess without bleeding: Secondary | ICD-10-CM | POA: Diagnosis not present

## 2021-11-21 DIAGNOSIS — G939 Disorder of brain, unspecified: Secondary | ICD-10-CM

## 2021-11-21 DIAGNOSIS — Z720 Tobacco use: Secondary | ICD-10-CM

## 2021-11-21 DIAGNOSIS — J0100 Acute maxillary sinusitis, unspecified: Secondary | ICD-10-CM

## 2021-11-21 DIAGNOSIS — E785 Hyperlipidemia, unspecified: Secondary | ICD-10-CM

## 2021-11-21 DIAGNOSIS — K219 Gastro-esophageal reflux disease without esophagitis: Secondary | ICD-10-CM

## 2021-11-21 DIAGNOSIS — R5383 Other fatigue: Secondary | ICD-10-CM

## 2021-11-21 DIAGNOSIS — M2550 Pain in unspecified joint: Secondary | ICD-10-CM | POA: Diagnosis not present

## 2021-11-21 DIAGNOSIS — E559 Vitamin D deficiency, unspecified: Secondary | ICD-10-CM | POA: Diagnosis not present

## 2021-11-21 DIAGNOSIS — F411 Generalized anxiety disorder: Secondary | ICD-10-CM

## 2021-11-21 DIAGNOSIS — R9089 Other abnormal findings on diagnostic imaging of central nervous system: Secondary | ICD-10-CM

## 2021-11-21 DIAGNOSIS — I10 Essential (primary) hypertension: Secondary | ICD-10-CM

## 2021-11-21 DIAGNOSIS — R059 Cough, unspecified: Secondary | ICD-10-CM | POA: Diagnosis not present

## 2021-11-21 DIAGNOSIS — R0602 Shortness of breath: Secondary | ICD-10-CM | POA: Diagnosis not present

## 2021-11-21 DIAGNOSIS — G43119 Migraine with aura, intractable, without status migrainosus: Secondary | ICD-10-CM

## 2021-11-21 DIAGNOSIS — R000 Tachycardia, unspecified: Secondary | ICD-10-CM

## 2021-11-21 DIAGNOSIS — R42 Dizziness and giddiness: Secondary | ICD-10-CM

## 2021-11-21 LAB — CBC WITH DIFFERENTIAL/PLATELET
Basophils Absolute: 0.1 10*3/uL (ref 0.0–0.1)
Basophils Relative: 1.2 % (ref 0.0–3.0)
Eosinophils Absolute: 0.1 10*3/uL (ref 0.0–0.7)
Eosinophils Relative: 1.5 % (ref 0.0–5.0)
HCT: 46.9 % — ABNORMAL HIGH (ref 36.0–46.0)
Hemoglobin: 15.9 g/dL — ABNORMAL HIGH (ref 12.0–15.0)
Lymphocytes Relative: 24.6 % (ref 12.0–46.0)
Lymphs Abs: 2.4 10*3/uL (ref 0.7–4.0)
MCHC: 33.8 g/dL (ref 30.0–36.0)
MCV: 87.8 fl (ref 78.0–100.0)
Monocytes Absolute: 0.5 10*3/uL (ref 0.1–1.0)
Monocytes Relative: 5.7 % (ref 3.0–12.0)
Neutro Abs: 6.4 10*3/uL (ref 1.4–7.7)
Neutrophils Relative %: 67 % (ref 43.0–77.0)
Platelets: 338 10*3/uL (ref 150.0–400.0)
RBC: 5.34 Mil/uL — ABNORMAL HIGH (ref 3.87–5.11)
RDW: 13.8 % (ref 11.5–15.5)
WBC: 9.6 10*3/uL (ref 4.0–10.5)

## 2021-11-21 LAB — LIPID PANEL
Cholesterol: 345 mg/dL — ABNORMAL HIGH (ref 0–200)
HDL: 42.6 mg/dL (ref >39.00)
NonHDL: 302.36
Total CHOL/HDL Ratio: 8
Triglycerides: 236 mg/dL — ABNORMAL HIGH (ref 0.0–149.0)
VLDL: 47.2 mg/dL — ABNORMAL HIGH (ref 0.0–40.0)

## 2021-11-21 LAB — TSH: TSH: 1.44 u[IU]/mL (ref 0.35–5.50)

## 2021-11-21 LAB — COMPREHENSIVE METABOLIC PANEL
ALT: 19 U/L (ref 0–35)
AST: 18 U/L (ref 0–37)
Albumin: 4.7 g/dL (ref 3.5–5.2)
Alkaline Phosphatase: 136 U/L — ABNORMAL HIGH (ref 39–117)
BUN: 8 mg/dL (ref 6–23)
CO2: 32 mEq/L (ref 19–32)
Calcium: 10 mg/dL (ref 8.4–10.5)
Chloride: 96 mEq/L (ref 96–112)
Creatinine, Ser: 1.2 mg/dL (ref 0.40–1.20)
GFR: 52.1 mL/min — ABNORMAL LOW (ref >60.00)
Glucose, Bld: 79 mg/dL (ref 70–99)
Potassium: 5.2 mEq/L — ABNORMAL HIGH (ref 3.5–5.1)
Sodium: 139 mEq/L (ref 135–145)
Total Bilirubin: 0.4 mg/dL (ref 0.2–1.2)
Total Protein: 7.6 g/dL (ref 6.0–8.3)

## 2021-11-21 LAB — C-REACTIVE PROTEIN: CRP: <1 mg/dL (ref 0.5–20.0)

## 2021-11-21 LAB — LDL CHOLESTEROL, DIRECT: Direct LDL: 262 mg/dL

## 2021-11-21 LAB — VITAMIN D 25 HYDROXY (VIT D DEFICIENCY, FRACTURES): VITD: 15.06 ng/mL — ABNORMAL LOW (ref 30.00–100.00)

## 2021-11-21 LAB — VITAMIN B12: Vitamin B-12: 233 pg/mL (ref 211–911)

## 2021-11-21 LAB — SEDIMENTATION RATE: Sed Rate: 15 mm/hr (ref 0–30)

## 2021-11-21 MED ORDER — PREDNISONE 20 MG PO TABS: ORAL_TABLET | ORAL | 0 refills | Status: DC

## 2021-11-21 MED ORDER — ATORVASTATIN CALCIUM 20 MG PO TABS: 20.0000 mg | ORAL_TABLET | Freq: Every day | ORAL | 3 refills | Status: DC

## 2021-11-21 MED ORDER — VITAMIN D (ERGOCALCIFEROL) 1.25 MG (50000 UNIT) PO CAPS: 50000.0000 [IU] | ORAL_CAPSULE | ORAL | 0 refills | Status: AC

## 2021-11-21 MED ORDER — AMOXICILLIN-POT CLAVULANATE 875-125 MG PO TABS: 1.0000 | ORAL_TABLET | Freq: Two times a day (BID) | ORAL | 0 refills | Status: AC

## 2021-11-21 NOTE — Assessment & Plan Note (Signed)
With recent finding on Brain MRI with frontal lesion as well as changes in white matter Referral placed neurology  Also will treat for sinusitis as may also be increasing dizziness

## 2021-11-21 NOTE — Assessment & Plan Note (Signed)
Prescription given for augmentin 875/125 mg po bid for ten days. Pt to continue tylenol/ibuprofen prn sinus pain. Continue with humidifier prn and steam showers recommended as well. instructed If no symptom improvement in 48 hours please f/u ? ?

## 2021-11-21 NOTE — Assessment & Plan Note (Signed)
cxr today pending results

## 2021-11-21 NOTE — Progress Notes (Signed)
Your vitamin D was a bit on the lower range. I will send an RX for vitamin D3 50,000 IU which you will take once weekly for 8 weeks. Once RX is complete, please continue over the counter Vitamin D3 1000 IU once daily. Return to the clinic for a follow up in three months to repeat your Vitamin D level.   Alkaline phos which is a liver marker with slight elevation, suspect this is due to alcohol intake. Can repeat next visit.   Elevated potassium x.1 point. Is pt taking potassium?   Cholesterol very elevated, was she fasting?let's restart lipitor sending on to pharmacy.   Please remind pt to let me know of blood pressure levels after this weekend as will likely need to start her on medication.   We will d/w pt anxiety medications at next visit. We need to use caution due to new finding on MRI

## 2021-11-21 NOTE — Assessment & Plan Note (Signed)
Asymptomatic at present ekg to consider at next visit

## 2021-11-21 NOTE — Assessment & Plan Note (Signed)
Work on low cholesterol diet, exercise as tolerated. May consider starting lipitor again.

## 2021-11-21 NOTE — Assessment & Plan Note (Signed)
Ordered vitamin d pending results.   

## 2021-11-21 NOTE — Assessment & Plan Note (Signed)
Smoking cessation instruction/counseling given:  counseled patient on the dangers of tobacco use, advised patient to stop smoking, and reviewed strategies to maximize success 

## 2021-11-21 NOTE — Assessment & Plan Note (Signed)
cxr today  Work on smoking cessation rx prednisone

## 2021-11-21 NOTE — Assessment & Plan Note (Signed)
Fatigue lab work up pending results

## 2021-11-21 NOTE — Assessment & Plan Note (Signed)
Advised pt that we need two more reports of blood pressure elevation to diagnose her officially and get started on medication. Pt to check daily for the next one week then we will f/u and consider starting medication. ekg likely next visit.

## 2021-11-21 NOTE — Assessment & Plan Note (Signed)
Reviewed MRI  Referral to neurology

## 2021-11-21 NOTE — Progress Notes (Signed)
New Patient Office Visit  Subjective:  Patient ID: Haley Hester, female    DOB: 07-13-1970  Age: 51 y.o. MRN: [id number]  CC:  Chief Complaint  Patient presents with   Establish Care    HPI Haley Hester is here to establish care as a new patient.  Prior provider was: Allie Bossier, NP back in 2018.  Pt is without acute concerns.   Colonoscopy, 10/03/19, every five years.  Often with fatigue.   C/o dizziness: Waking up in the am eyes feel 'funky' gritty sensation.  She does feel a lot of sinus pressure.  She had MRI brain, chronic lacunar infarct within the left corona radiata. Several small scattered foci T2 flair hyperintense signal abn elsewhere within bil fontal lobe with white matter.   Impression: indeterminate 8 mm hypointense and enhancing lesion within left frontal calvarium , background nonspecific heterogeneity calvarial marrow signal  Small volume fluid in bil mastoid air cells  Small foci T2 flair, possible ddx small vessel ischemia, sequela of chronic migraine, sequela Of prior infectious inflammatory process or demyelinating such as MS.  Recommended f/u 4-6 month f/u MRI to ensure stablilty.   Bil ankle, bil knee pain with stiffness. Hard to get walking in the am.   chronic concerns:  Anxiety/depression: tried effexor in the past however didn't really notice a difference. Was on lowest dose. She was on other medications however doesn't remember the names of those medications.   HTN: doesn't really check it at home that often, around 237 systolic and lower number 90/100. Or so. No cp or sob. Does feel sometimes with rapid heart beat.   HLD: pt states used to take lipitor in the past however has not been on in some time. Once she ran out she stopped taking.   Alcohol: drinking beer almost daily, up to six a day at a time.   Past Medical History:  Diagnosis Date   Arthritis    GERD (gastroesophageal reflux disease)    Hyperlipidemia    Hypertension     Migraines     Past Surgical History:  Procedure Laterality Date   ABDOMINAL HYSTERECTOMY     partial, still with ovaries and tubes   COLONOSCOPY WITH ESOPHAGOGASTRODUODENOSCOPY (EGD)     COLONOSCOPY WITH PROPOFOL N/A 10/03/2019   Procedure: COLONOSCOPY WITH PROPOFOL;  Surgeon: Lin Landsman, MD;  Location: [facility] END[facility]COPY;  Service: Gastroenterology;  Laterality: N/A;    Family History  Problem Relation Age of Onset   Hyperlipidemia Mother    Hypertension Mother    Healthy Father        killed on his job   Heart disease Maternal Grandmother    COPD Paternal Aunt     Social History   Socioeconomic History   Marital status: Married    Spouse name: Not on file   Number of children: 2   Years of education: Not on file   Highest education level: Not on file  Occupational History    Employer: [employer]  Tobacco Use   Smoking status: Every Day    Packs/day: 0.50    Years: 40.00    Total pack years: 20.00    Types: Cigarettes   Smokeless tobacco: Former    Types: Nurse, children's Use: Never used  Substance and Sexual Activity   Alcohol use: Yes    Alcohol/week: 30.0 standard drinks of alcohol    Types: 30 Cans of beer per week  Comment: daily mostly   Drug use: Not Currently   Sexual activity: Yes    Partners: Male    Birth control/protection: None  Other Topics Concern   Not on file  Social History Narrative   One girl one boy    Social Determinants of Radio broadcast assistant Strain: Not on file  Food Insecurity: Not on file  Transportation Needs: Not on file  Physical Activity: Not on file  Stress: Not on file  Social Connections: Not on file  Intimate Partner Violence: Not on file    Outpatient Medications Prior to Visit  Medication Sig Dispense Refill   acetaminophen (TYLENOL) 325 MG tablet Take 650 mg by mouth every 6 (six) hours as needed. (Patient not taking: Reported on 11/21/2021)     atorvastatin (LIPITOR) 20 MG  tablet Take 20 mg by mouth daily. (Patient not taking: Reported on 11/21/2021)     azelastine (ASTELIN) 0.1 % nasal spray Place 2 sprays into both nostrils 2 (two) times daily. (Patient not taking: Reported on 11/21/2021)     dicyclomine (BENTYL) 10 MG capsule Take 1 capsule (10 mg total) by mouth 4 (four) times daily -  before meals and at bedtime for 30 doses. 30 capsule 0   Multiple Vitamin (MULTI-VITAMIN) tablet Take by mouth. (Patient not taking: Reported on 11/21/2021)     omeprazole (PRILOSEC) 20 MG capsule Take 1 capsule (20 mg total) by mouth daily. (Patient not taking: Reported on 11/21/2021) 90 capsule 1   traZODone (DESYREL) 50 MG tablet Take 1 tablet (50 mg total) by mouth at bedtime as needed for sleep. (Patient not taking: Reported on 11/21/2021) 90 tablet 0   venlafaxine (EFFEXOR) 37.5 MG tablet Take 37.5 mg by mouth daily. Take 2 tablets daily for 1 week then increased to 4 tablets daily (Patient not taking: Reported on 11/21/2021)     No facility-administered medications prior to visit.    No Known Allergies      Objective:    Physical Exam Vitals reviewed.  Constitutional:      General: She is not in acute distress.    Appearance: Normal appearance. She is normal weight. She is not ill-appearing, toxic-appearing or diaphoretic.  HENT:     Right Ear: Tympanic membrane normal.     Left Ear: Tympanic membrane normal.     Nose: Mucosal edema and congestion present. No nasal tenderness.     Right Turbinates: Swollen.     Left Turbinates: Swollen.     Right Sinus: Maxillary sinus tenderness present. No frontal sinus tenderness.     Left Sinus: Maxillary sinus tenderness present. No frontal sinus tenderness.     Mouth/Throat:     Mouth: Mucous membranes are moist.     Pharynx: No pharyngeal swelling.     Tonsils: No tonsillar exudate.  Eyes:     Extraocular Movements: Extraocular movements intact.     Conjunctiva/sclera: Conjunctivae normal.     Pupils: Pupils are equal, round,  and reactive to light.  Neck:     Thyroid: No thyroid mass.  Cardiovascular:     Rate and Rhythm: Normal rate and regular rhythm.  Pulmonary:     Effort: Pulmonary effort is normal.     Breath sounds: Decreased air movement present. Examination of the right-upper field reveals wheezing. Examination of the left-upper field reveals wheezing. Wheezing present. No rhonchi or rales.  Abdominal:     General: Abdomen is flat. Bowel sounds are normal.     Palpations: Abdomen  is soft.  Musculoskeletal:        General: Normal range of motion.     Right lower leg: No edema.     Left lower leg: No edema.  Lymphadenopathy:     Cervical:     Right cervical: No superficial cervical adenopathy.    Left cervical: No superficial cervical adenopathy.  Skin:    General: Skin is warm.     Capillary Refill: Capillary refill takes less than 2 seconds.  Neurological:     General: No focal deficit present.     Mental Status: She is alert and oriented to person, place, and time.  Psychiatric:        Mood and Affect: Mood normal.        Behavior: Behavior normal.        Thought Content: Thought content normal.        Judgment: Judgment normal.     \  BP (!) 142/86   Pulse 85   Temp 98.7 F (37.1 C)   Resp 16   Ht '5\' 8"'$  (1.727 m)   Wt 131 lb 2 oz (59.5 kg)   SpO2 98%   BMI 19.94 kg/m  Wt Readings from Last 3 Encounters:  11/21/21 131 lb 2 oz (59.5 kg)  10/03/19 150 lb (68 kg)  09/20/19 150 lb 8 oz (68.3 kg)     Health Maintenance Due  Topic Date Due   COVID-19 Vaccine (1) Never done   HIV Screening  Never done   Hepatitis C Screening  Never done   TETANUS/TDAP  Never done   PAP SMEAR-Modifier  Never done   MAMMOGRAM  Never done   Zoster Vaccines- Shingrix (1 of 2) Never done   INFLUENZA VACCINE  11/19/2021    There are no preventive care reminders to display for this patient.  Lab Results  Component Value Date   TSH 1.44 11/21/2021   Lab Results  Component Value Date   WBC  9.6 11/21/2021   HGB 15.9 (H) 11/21/2021   HCT 46.9 (H) 11/21/2021   MCV 87.8 11/21/2021   PLT 338.0 11/21/2021   Lab Results  Component Value Date   NA 139 11/21/2021   K 5.2 (H) 11/21/2021   CO2 32 11/21/2021   GLUCOSE 79 11/21/2021   BUN 8 11/21/2021   CREATININE 1.20 11/21/2021   BILITOT 0.4 11/21/2021   ALKPHOS 136 (H) 11/21/2021   AST 18 11/21/2021   ALT 19 11/21/2021   PROT 7.6 11/21/2021   ALBUMIN 4.7 11/21/2021   CALCIUM 10.0 11/21/2021   ANIONGAP 9 09/02/2019   GFR 52.10 (L) 11/21/2021   Lab Results  Component Value Date   CHOL 345 (H) 11/21/2021   Lab Results  Component Value Date   HDL 42.60 11/21/2021   Lab Results  Component Value Date   LDLCALC 259 (H) 03/07/2016   Lab Results  Component Value Date   TRIG 236.0 (H) 11/21/2021   Lab Results  Component Value Date   CHOLHDL 8 11/21/2021   No results found for: "HGBA1C"    Assessment & Plan:   Problem List Items Addressed This Visit       Cardiovascular and Mediastinum   Essential hypertension    Advised pt that we need two more reports of blood pressure elevation to diagnose her officially and get started on medication. Pt to check daily for the next one week then we will f/u and consider starting medication. ekg likely next visit.  Intractable migraine with aura without status migrainosus   Relevant Orders   Ambulatory referral to Neurology   RESOLVED: Primary hypertension     Respiratory   Acute non-recurrent maxillary sinusitis    Prescription given for augmentin 875/125 mg po bid for ten days. Pt to continue tylenol/ibuprofen prn sinus pain. Continue with humidifier prn and steam showers recommended as well. instructed If no symptom improvement in 48 hours please f/u       Relevant Medications   amoxicillin-clavulanate (AUGMENTIN) 875-125 MG tablet   predniSONE (DELTASONE) 20 MG tablet     Digestive   Gastroesophageal reflux disease    Has taken prilosec in the past not  currently taking.  Try to decrease and or avoid spicy foods, fried fatty foods, and also caffeine and chocolate as these can increase heartburn symptoms.        Diverticulosis    Noted on colonoscopy  asymptomatic        Nervous and Auditory   Lesion of frontal lobe of brain    Reviewed MRI  Referral to neurology      Relevant Orders   Ambulatory referral to Neurology     Other   GAD (generalized anxiety disorder)    Can consider ssri, was on effexor in past. Doesn't remember what else on.  Labs first then will d/w pt options for starting.       Hyperlipidemia    Work on low cholesterol diet, exercise as tolerated. May consider starting lipitor again.       Relevant Orders   Comprehensive metabolic panel (Completed)   Lipid panel (Completed)   Tobacco abuse - Primary    Smoking cessation instruction/counseling given:  counseled patient on the dangers of tobacco use, advised patient to stop smoking, and reviewed strategies to maximize success       Relevant Medications   amoxicillin-clavulanate (AUGMENTIN) 875-125 MG tablet   Other Relevant Orders   Ambulatory Referral Lung Cancer Screening [facility]   CBC with Differential/Platelet (Completed)   Vitamin D deficiency    Ordered vitamin d pending results.        Relevant Orders   VITAMIN D 25 Hydroxy (Vit-D Deficiency, Fractures) (Completed)   Other fatigue    Fatigue lab work up pending results      Relevant Orders   TSH (Completed)   Vitamin B12 (Completed)   Ambulatory referral to Neurology   Polyarthralgia    Antiinflammatory workup today pending results      Relevant Medications   predniSONE (DELTASONE) 20 MG tablet   Other Relevant Orders   C-reactive protein (Completed)   Sedimentation rate (Completed)   Rheumatoid factor   ANA   Wheezing    cxr today  Work on smoking cessation rx prednisone       Relevant Medications   amoxicillin-clavulanate (AUGMENTIN) 875-125 MG tablet    predniSONE (DELTASONE) 20 MG tablet   Other Relevant Orders   DG Chest 2 View (Completed)   Rapid heart beat    Asymptomatic at present ekg to consider at next visit      Abnormal brain MRI    Reviewed and referral placed for neurology      Relevant Orders   Ambulatory referral to Neurology   Dizziness    With recent finding on Brain MRI with frontal lesion as well as changes in white matter Referral placed neurology  Also will treat for sinusitis as may also be increasing dizziness      Relevant Orders  Ambulatory referral to Neurology   DOE (dyspnea on exertion)    cxr today pending results      Relevant Medications   amoxicillin-clavulanate (AUGMENTIN) 875-125 MG tablet   Other Relevant Orders   DG Chest 2 View (Completed)    Meds ordered this encounter  Medications   amoxicillin-clavulanate (AUGMENTIN) 875-125 MG tablet    Sig: Take 1 tablet by mouth 2 (two) times daily for 10 days.    Dispense:  20 tablet    Refill:  0    Order Specific Question:   Supervising Provider    Answer:   BEDSOLE, AMY E [[id number]]   predniSONE (DELTASONE) 20 MG tablet    Sig: Take 2 tablets by mouth once daily for 5 days.    Dispense:  10 tablet    Refill:  0    Order Specific Question:   Supervising Provider    Answer:   Diona Browner, AMY E [[id number]]    Follow-up: Return in about 2 weeks (around 12/05/2021) for follow up on blood pressure.    Eugenia Pancoast, FNP

## 2021-11-21 NOTE — Assessment & Plan Note (Signed)
Antiinflammatory workup today pending results

## 2021-11-21 NOTE — Assessment & Plan Note (Signed)
Reviewed and referral placed for neurology

## 2021-11-21 NOTE — Patient Instructions (Addendum)
Complete xray(s) prior to leaving today. I will notify you of your results once received.  A referral was placed today for neurology. Please let us know if you have not heard back within 2 weeks about the referral.  Welcome to our clinic, I am happy to have you as my new patient. I am excited to continue on this healthcare journey with you.  Stop by the lab prior to leaving today. I will notify you of your results once received.   Please keep in mind Any my chart messages you send have up to a three business day turnaround for a response.  Phone calls may take up to a one full business day turnaround for a  response.   Check blood pressure at home, come back in with blood pressure log.   If you need a medication refill I recommend you request it through the pharmacy as this is easiest for [place] rather than sending a message and or phone call.   Due to recent changes in healthcare laws, you may see results of your imaging and/or laboratory studies on MyChart before I have had a chance to review them.  I understand that in some cases there may be results that are confusing or concerning to you. Please understand that not all results are received at the same time and often I may need to interpret multiple results in order to provide you with the best plan of care or course of treatment. Therefore, I ask that you please give me 2 business days to thoroughly review all your results before contacting my office for clarification. Should we see a critical lab result, you will be contacted sooner.   It was a pleasure seeing you today! Please do not hesitate to reach out with any questions and or concerns.  Regards,   Eugenia Pancoast FNP-C

## 2021-11-21 NOTE — Assessment & Plan Note (Signed)
Can consider ssri, was on effexor in past. Doesn't remember what else on.  Labs first then will d/w pt options for starting.

## 2021-11-21 NOTE — Assessment & Plan Note (Signed)
Noted on colonoscopy  asymptomatic

## 2021-11-21 NOTE — Assessment & Plan Note (Signed)
Has taken prilosec in the past not currently taking.  Try to decrease and or avoid spicy foods, fried fatty foods, and also caffeine and chocolate as these can increase heartburn symptoms.

## 2021-11-24 LAB — RHEUMATOID FACTOR: Rheumatoid fact SerPl-aCnc: <14 IU/mL (ref <14)

## 2021-11-24 LAB — ANA: Anti Nuclear Antibody (ANA): NEGATIVE

## 2021-11-25 ENCOUNTER — Telehealth: Payer: Self-pay | Admitting: Family

## 2021-11-25 NOTE — Telephone Encounter (Signed)
Patient called back in returning a call she received. Stated its best to call her around 11:35 as she go on lunch at 11:30. Thank you!

## 2021-12-02 ENCOUNTER — Ambulatory Visit (INDEPENDENT_AMBULATORY_CARE_PROVIDER_SITE_OTHER): Payer: BC Managed Care – PPO | Admitting: Family

## 2021-12-02 ENCOUNTER — Encounter: Payer: Self-pay | Admitting: Family

## 2021-12-02 ENCOUNTER — Telehealth: Payer: Self-pay

## 2021-12-02 VITALS — BP 150/84 | HR 100 | Temp 98.6°F | Resp 16 | Ht 68.0 in | Wt 130.2 lb

## 2021-12-02 DIAGNOSIS — R221 Localized swelling, mass and lump, neck: Secondary | ICD-10-CM | POA: Diagnosis not present

## 2021-12-02 DIAGNOSIS — I10 Essential (primary) hypertension: Secondary | ICD-10-CM | POA: Diagnosis not present

## 2021-12-02 DIAGNOSIS — J301 Allergic rhinitis due to pollen: Secondary | ICD-10-CM

## 2021-12-02 DIAGNOSIS — J029 Acute pharyngitis, unspecified: Secondary | ICD-10-CM | POA: Insufficient documentation

## 2021-12-02 DIAGNOSIS — R9089 Other abnormal findings on diagnostic imaging of central nervous system: Secondary | ICD-10-CM

## 2021-12-02 DIAGNOSIS — J0110 Acute frontal sinusitis, unspecified: Secondary | ICD-10-CM | POA: Diagnosis not present

## 2021-12-02 LAB — POCT RAPID STREP A (OFFICE): Rapid Strep A Screen: NEGATIVE

## 2021-12-02 MED ORDER — AMLODIPINE BESYLATE 5 MG PO TABS: 5.0000 mg | ORAL_TABLET | Freq: Every day | ORAL | 1 refills | Status: DC

## 2021-12-02 MED ORDER — DOXYCYCLINE HYCLATE 100 MG PO TABS: 100.0000 mg | ORAL_TABLET | Freq: Two times a day (BID) | ORAL | 0 refills | Status: AC

## 2021-12-02 MED ORDER — MONTELUKAST SODIUM 10 MG PO TABS: 10.0000 mg | ORAL_TABLET | Freq: Every day | ORAL | 1 refills | Status: DC

## 2021-12-02 NOTE — Progress Notes (Signed)
Established Patient Office Visit  Subjective:  Patient ID: Haley Hester, female    DOB: [birth year]  Age: 51 y.o. MRN: [id number]  CC:  Chief Complaint  Patient presents with   Headache    X 2 weeks   Sore Throat    X 2 days   Ear Pain    X 2 days    HPI Haley Hester is here today with concerns.   Sinusitis: still worsening left sided ear pain, feels like a sharp stabbing sensation.  On augmentin 875/125 mg po bid but this isn't really helping with symptoms.  Also on prednisone without much relief.   Still with nasal congestion runny nose.  Not so much sore throat. Left lower side of face with tenderness.  No trouble swallowing.  Not hard to make saliva.  Wheezing has improved however.   No chest congestion. No cough other than daily 'smokers cough Headache is still ongoing. Waxes and wanes not that bad.   Has not yet tested for covid.  Blood pressure: still averaging higher than normal. Today 150/86. At home average around 168/101   Also with neck mass right side, been there since 2012. No increase in size. Fix non-mobile. Non-tender.   Past Medical History:  Diagnosis Date   Arthritis    GERD (gastroesophageal reflux disease)    Hyperlipidemia    Hypertension    Migraines     Past Surgical History:  Procedure Laterality Date   ABDOMINAL HYSTERECTOMY     partial, still with ovaries and tubes   COLONOSCOPY WITH ESOPHAGOGASTRODUODENOSCOPY (EGD)     COLONOSCOPY WITH PROPOFOL N/A 10/03/2019   Procedure: COLONOSCOPY WITH PROPOFOL;  Surgeon: Lin Landsman, MD;  Location: [facility] ENDOSCOPY;  Service: Gastroenterology;  Laterality: N/A;    Family History  Problem Relation Age of Onset   Hyperlipidemia Mother    Hypertension Mother    Healthy Father        killed on his job   Heart disease Maternal Grandmother    COPD Paternal Aunt     Social History   Socioeconomic History   Marital status: Married    Spouse name: Not on file   Number of  children: 2   Years of education: Not on file   Highest education level: Not on file  Occupational History    Employer: [employer]  Tobacco Use   Smoking status: Every Day    Packs/day: 0.50    Years: 40.00    Total pack years: 20.00    Types: Cigarettes   Smokeless tobacco: Former    Types: Nurse, children's Use: Never used  Substance and Sexual Activity   Alcohol use: Yes    Alcohol/week: 30.0 standard drinks of alcohol    Types: 30 Cans of beer per week    Comment: daily mostly   Drug use: Not Currently   Sexual activity: Yes    Partners: Male    Birth control/protection: None  Other Topics Concern   Not on file  Social History Narrative   One girl one boy    Social Determinants of Radio broadcast assistant Strain: Not on file  Food Insecurity: Not on file  Transportation Needs: Not on file  Physical Activity: Not on file  Stress: Not on file  Social Connections: Not on file  Intimate Partner Violence: Not on file    Outpatient Medications Prior to Visit  Medication Sig Dispense Refill   atorvastatin (  LIPITOR) 20 MG tablet Take 1 tablet (20 mg total) by mouth daily. 90 tablet 3   predniSONE (DELTASONE) 20 MG tablet Take 2 tablets by mouth once daily for 5 days. 10 tablet 0   Vitamin D, Ergocalciferol, (DRISDOL) 1.25 MG (50000 UNIT) CAPS capsule Take 1 capsule (50,000 Units total) by mouth every 7 (seven) days for 8 doses. 8 capsule 0   No facility-administered medications prior to visit.    No Known Allergies      Objective:    Physical Exam Constitutional:      General: She is not in acute distress.    Appearance: Normal appearance. She is normal weight. She is not ill-appearing, toxic-appearing or diaphoretic.  HENT:     Right Ear: Tympanic membrane normal.     Left Ear: Tympanic membrane normal.     Nose: Congestion and rhinorrhea present. No mucosal edema.     Right Turbinates: Not enlarged or swollen.     Left Turbinates: Not  enlarged or swollen.     Right Sinus: Frontal sinus tenderness present. No maxillary sinus tenderness.     Left Sinus: Frontal sinus tenderness present. No maxillary sinus tenderness.     Mouth/Throat:     Mouth: Mucous membranes are moist.     Pharynx: Posterior oropharyngeal erythema (with cobblestone) present. No pharyngeal swelling or oropharyngeal exudate.     Tonsils: No tonsillar exudate. 0 on the right. 0 on the left.  Eyes:     Extraocular Movements: Extraocular movements intact.     Conjunctiva/sclera: Conjunctivae normal.     Pupils: Pupils are equal, round, and reactive to light.  Neck:     Thyroid: No thyroid mass.  Cardiovascular:     Rate and Rhythm: Normal rate and regular rhythm.  Pulmonary:     Effort: Pulmonary effort is normal.     Breath sounds: Normal breath sounds.  Lymphadenopathy:     Cervical:     Right cervical: No superficial cervical adenopathy.    Left cervical: No superficial cervical adenopathy.  Neurological:     Mental Status: She is alert.     BP (!) 150/84   Pulse 100   Temp 98.6 F (37 C)   Resp 16   Ht '5\' 8"'$  (1.727 m)   Wt 130 lb 4 oz (59.1 kg)   SpO2 97%   BMI 19.80 kg/m  Wt Readings from Last 3 Encounters:  12/02/21 130 lb 4 oz (59.1 kg)  11/21/21 131 lb 2 oz (59.5 kg)  10/03/19 150 lb (68 kg)     Health Maintenance Due  Topic Date Due   COVID-19 Vaccine (1) Never done   HIV Screening  Never done   Hepatitis C Screening  Never done   TETANUS/TDAP  Never done   PAP SMEAR-Modifier  Never done   MAMMOGRAM  Never done   Zoster Vaccines- Shingrix (1 of 2) Never done   INFLUENZA VACCINE  11/19/2021    There are no preventive care reminders to display for this patient.  Lab Results  Component Value Date   TSH 1.44 11/21/2021   Lab Results  Component Value Date   WBC 9.6 11/21/2021   HGB 15.9 (H) 11/21/2021   HCT 46.9 (H) 11/21/2021   MCV 87.8 11/21/2021   PLT 338.0 11/21/2021   Lab Results  Component Value Date    NA 139 11/21/2021   K 5.2 (H) 11/21/2021   CO2 32 11/21/2021   GLUCOSE 79 11/21/2021   BUN 8  11/21/2021   CREATININE 1.20 11/21/2021   BILITOT 0.4 11/21/2021   ALKPHOS 136 (H) 11/21/2021   AST 18 11/21/2021   ALT 19 11/21/2021   PROT 7.6 11/21/2021   ALBUMIN 4.7 11/21/2021   CALCIUM 10.0 11/21/2021   ANIONGAP 9 09/02/2019   GFR 52.10 (L) 11/21/2021   No results found for: "HGBA1C"    Assessment & Plan:   Problem List Items Addressed This Visit       Cardiovascular and Mediastinum   Essential hypertension    Start amlodipine 5 mg once daily Pt advised of the following:  Continue medication as prescribed. Monitor blood pressure periodically and/or when you feel symptomatic. Goal is <130/90 on average. Ensure that you have rested for 30 minutes prior to checking your blood pressure. Record your readings and bring them to your next visit if necessary.work on a low sodium diet.       Relevant Medications   amLODipine (NORVASC) 5 MG tablet     Respiratory   Acute non-recurrent frontal sinusitis    augmentin completed Start doxycycline 100 mg twice daily x 10 days  Suspect uncontrolled allergies as well  Will treat.        Relevant Medications   doxycycline (VIBRA-TABS) 100 MG tablet   montelukast (SINGULAIR) 10 MG tablet   Non-seasonal allergic rhinitis due to pollen    Start zyrtec nightly flonase daily and singulair 10 mg rx in          Other   Abnormal brain MRI    Stressed f/u with neurology. Call to make appt.      Mass of right side of neck    US neck , we will evaluate once done.  Suspected lipoma      Relevant Orders   US Soft Tissue Head/Neck (NON-THYROID)   Sore throat - Primary    Strep tested in office, negative.  Warm salt water gargles        Relevant Orders   POCT rapid strep A    Meds ordered this encounter  Medications   amLODipine (NORVASC) 5 MG tablet    Sig: Take 1 tablet (5 mg total) by mouth daily.    Dispense:  90 tablet     Refill:  1    Order Specific Question:   Supervising Provider    Answer:   BEDSOLE, AMY E [[id number]]   doxycycline (VIBRA-TABS) 100 MG tablet    Sig: Take 1 tablet (100 mg total) by mouth 2 (two) times daily for 10 days.    Dispense:  20 tablet    Refill:  0    Order Specific Question:   Supervising Provider    Answer:   BEDSOLE, AMY E [[id number]]   montelukast (SINGULAIR) 10 MG tablet    Sig: Take 1 tablet (10 mg total) by mouth at bedtime.    Dispense:  90 tablet    Refill:  1    Order Specific Question:   Supervising Provider    Answer:   BEDSOLE, AMY E [[id number]]    Follow-up: Return in about 1 month (around 01/02/2022) for for blood pressure follow up .    Eugenia Pancoast, FNP

## 2021-12-02 NOTE — Assessment & Plan Note (Signed)
Start zyrtec nightly flonase daily and singulair 10 mg rx in

## 2021-12-02 NOTE — Telephone Encounter (Signed)
Patient has appointment scheduled already with PCP today   [facility] Patient Name: Haley Hester Gender: Female DOB: January 31, 1971 Age: 51 Y 62 M 17 D Return Phone Number: [phone] (Primary) Address: City/ State/ Zip: [place] [address] Client [place] [facility] - Client Client Site [place] Provider [place] - PHYSICIAN, NOT LISTED- MD Contact Type Call Who Is Calling Patient / Member / Family / Caregiver Call Type Triage / Clinical Relationship To Patient Self Return Phone Number [phone] (Primary) Chief Complaint Headache Reason for Call Symptomatic / Request for [place] states sees Tabitha; having issues w/left side of face; MRI; has appt Thurs; feels like someone is jabbing something in ear when headache begins; still has runny nose after prednisone and amoxicillin; Translation No Nurse Assessment Nurse: Jearld Pies, RN, Lovena Le Date/Time (Eastern Time): 12/02/2021 7:16:22 AM Confirm and document reason for call. If symptomatic, describe symptoms. ---Pt has ear pain, headache, congestion, sore throat, productive cough, and runny nose X 2 weeks. Pt finished amoxicillin but still on prednisone with minimal relief. Current pain level 5/10. No pan reliever has been taken this morning. Drinking fluids and urinating normally. Denies fever, chest pain, or any other symptoms at this time. Does the patient have any new or worsening symptoms? ---Yes Will a triage be completed? ---Yes Related visit to physician within the last 2 weeks? ---Yes Does the PT have any chronic conditions? (i.e. diabetes, asthma, this includes High risk factors for pregnancy, etc.) ---Yes List chronic conditions. ---Anxiety, High cholesterol Is the patient pregnant or possibly pregnant? (Ask all females between the ages of 23-55)  ---No Is this a behavioral health or substance abuse call? ---No Guidelines Guideline Title Affirmed Question Affirmed Notes Nurse Date/Time Eilene Ghazi Time) Cough - Acute Productive Wheezing is present Jake Bathe 12/02/2021 7:19:51 AM PLEASE NOTE: All timestamps contained within this report are represented as [place] Standard Time. CONFIDENTIALTY NOTICE: This fax transmission is intended only for the addressee. It contains information that is legally privileged, confidential or otherwise protected from use or disclosure. If you are not the intended recipient, you are strictly prohibited from reviewing, disclosing, copying using or disseminating any of this information or taking any action in reliance on or regarding this information. If you have received this fax in error, please notify us immediately by telephone so that we can arrange for its return to [place]. Phone: [phone], Toll-Free: [phone], Fax: [phone] Page: 2 of 2 Call Id: [id number] [place]. Time Eilene Ghazi Time) Disposition Final User 12/02/2021 7:23:13 AM See HCP within 4 Hours (or PCP triage) Yes Jearld Pies, RN, Lovena Le Final Disposition 12/02/2021 7:23:13 AM See HCP within 4 Hours (or PCP triage) Yes Jearld Pies, RN, Apolonio Schneiders Disagree/Comply Comply Caller Understands Yes PreDisposition Call Doctor Care Advice Given Per Guideline * IF OFFICE WILL BE OPEN: You need to be seen within the next 3 or 4 hours. Call your doctor (or NP/PA) now or as soon as the office opens. SEE HCP (OR PCP TRIAGE) WITHIN 4 HOURS: CALL BACK IF: Referrals REFERRED TO PCP OFFICE

## 2021-12-02 NOTE — Patient Instructions (Addendum)
CALL AND MAKE APPT WITH NEUROLOGY!!   I am changing your antibiotic.  Start doxycycline 100 mg twice daily.   Your imaging for ultrasound of the neck. Has been ordered at the following location.  Please call to schedule a time and date that would work for you.   [facility] outpatient imaging center [address], [place] [address] Phone [phone]-  8-5 pm   Recommend nightly zyrtec.  Start flonase daily.  Start singulair 10 mg once daily.   For your blood pressure we will start amlodipine 5 mg once daily   Pt advised of the following:  Continue medication as prescribed. Monitor blood pressure periodically and/or when you feel symptomatic. Goal is <130/90 on average. Ensure that you have rested for 30 minutes prior to checking your blood pressure. Record your readings and bring them to your next visit if necessary.work on a low sodium diet.   Due to recent changes in healthcare laws, you may see results of your imaging and/or laboratory studies on MyChart before I have had a chance to review them.  I understand that in some cases there may be results that are confusing or concerning to you. Please understand that not all results are received at the same time and often I may need to interpret multiple results in order to provide you with the best plan of care or course of treatment. Therefore, I ask that you please give me 2 business days to thoroughly review all your results before contacting my office for clarification. Should we see a critical lab result, you will be contacted sooner.   It was a pleasure seeing you today! Please do not hesitate to reach out with any questions and or concerns.  Regards,   Eugenia Pancoast FNP-C

## 2021-12-02 NOTE — Assessment & Plan Note (Signed)
Stressed f/u with neurology. Call to make appt.

## 2021-12-02 NOTE — Assessment & Plan Note (Signed)
Strep tested in office, negative Warm salt water gargles   

## 2021-12-02 NOTE — Assessment & Plan Note (Signed)
US neck , we will evaluate once done.  Suspected lipoma

## 2021-12-02 NOTE — Assessment & Plan Note (Signed)
Start amlodipine 5 mg once daily Pt advised of the following:  Continue medication as prescribed. Monitor blood pressure periodically and/or when you feel symptomatic. Goal is <130/90 on average. Ensure that you have rested for 30 minutes prior to checking your blood pressure. Record your readings and bring them to your next visit if necessary.work on a low sodium diet.

## 2021-12-02 NOTE — Assessment & Plan Note (Signed)
augmentin completed Start doxycycline 100 mg twice daily x 10 days  Suspect uncontrolled allergies as well  Will treat.

## 2021-12-02 NOTE — Telephone Encounter (Signed)
Saw patient already, thank you for speaking with the patient.  Please see note for further information.  

## 2021-12-03 NOTE — Progress Notes (Signed)
Seen 8/14 and this was addressed.

## 2021-12-05 ENCOUNTER — Ambulatory Visit: Payer: BC Managed Care – PPO | Admitting: Family

## 2022-01-02 ENCOUNTER — Encounter: Payer: Self-pay | Admitting: Family

## 2022-01-02 ENCOUNTER — Ambulatory Visit (INDEPENDENT_AMBULATORY_CARE_PROVIDER_SITE_OTHER): Payer: BC Managed Care – PPO | Admitting: Family

## 2022-01-02 VITALS — BP 110/62 | HR 100 | Temp 98.1°F | Ht 68.0 in | Wt 131.0 lb

## 2022-01-02 DIAGNOSIS — Z7689 Persons encountering health services in other specified circumstances: Secondary | ICD-10-CM | POA: Diagnosis not present

## 2022-01-02 DIAGNOSIS — R748 Abnormal levels of other serum enzymes: Secondary | ICD-10-CM | POA: Diagnosis not present

## 2022-01-02 DIAGNOSIS — E875 Hyperkalemia: Secondary | ICD-10-CM | POA: Diagnosis not present

## 2022-01-02 DIAGNOSIS — E785 Hyperlipidemia, unspecified: Secondary | ICD-10-CM

## 2022-01-02 DIAGNOSIS — E538 Deficiency of other specified B group vitamins: Secondary | ICD-10-CM | POA: Diagnosis not present

## 2022-01-02 DIAGNOSIS — R930 Abnormal findings on diagnostic imaging of skull and head, not elsewhere classified: Secondary | ICD-10-CM | POA: Diagnosis not present

## 2022-01-02 DIAGNOSIS — M2550 Pain in unspecified joint: Secondary | ICD-10-CM

## 2022-01-02 DIAGNOSIS — M545 Low back pain, unspecified: Secondary | ICD-10-CM | POA: Diagnosis not present

## 2022-01-02 DIAGNOSIS — R200 Anesthesia of skin: Secondary | ICD-10-CM | POA: Diagnosis not present

## 2022-01-02 MED ORDER — MELOXICAM 7.5 MG PO TABS: 7.5000 mg | ORAL_TABLET | Freq: Every day | ORAL | 0 refills | Status: DC

## 2022-01-02 NOTE — Patient Instructions (Addendum)
Start daily vitamin B12 once daily at 1000 mcg once daily.   Take daily vitamin D once daily at 2000 IU once daily.   Trial meloxicam start at 7.37monce takingdaily. See if this helps with pain   Stop by the lab prior to leaving today. I will notify you of your results once received.  Regards,   TEugenia PancoastFNP-C

## 2022-01-02 NOTE — Progress Notes (Unsigned)
Established Patient Office Visit  Subjective:  Patient ID: Haley Hester, female    DOB: May 17, 1970  Age: 51 y.o. MRN: [id number]  CC:  Chief Complaint  Patient presents with   Hypertension    Has not been doing readings at home all the time.     HPI Haley Hester is here today for follow up.   HTN: readings at home average around  On amlodipine 5 mg once daily. Does find on average at home around 150/90. Sits for about 30 minutes and then checks her blood pressure. Not much below 130.   Hyperlipidemia: started atorvastatin, tolerating well.  Lab Results  Component Value Date   CHOL 345 (H) 11/21/2021   HDL 42.60 11/21/2021   LDLCALC 259 (H) 03/07/2016   LDLDIRECT 262.0 11/21/2021   TRIG 236.0 (H) 11/21/2021   CHOLHDL 8 11/21/2021   Vitamin b12 def: not taking anything over the counter.   Did see neurology, Dr. Melrose Nakayama. She has f/u October 24, started on pamelor and plan to repeat MRI in 4-6 months to monitor.     Past Medical History:  Diagnosis Date   Arthritis    GERD (gastroesophageal reflux disease)    Hyperlipidemia    Hypertension    Migraines     Past Surgical History:  Procedure Laterality Date   ABDOMINAL HYSTERECTOMY     partial, still with ovaries and tubes   COLONOSCOPY WITH ESOPHAGOGASTRODUODENOSCOPY (EGD)     COLONOSCOPY WITH PROPOFOL N/A 10/03/2019   Procedure: COLONOSCOPY WITH PROPOFOL;  Surgeon: Lin Landsman, MD;  Location: [facility] ENDOSCOPY;  Service: Gastroenterology;  Laterality: N/A;    Family History  Problem Relation Age of Onset   Hyperlipidemia Mother    Hypertension Mother    Healthy Father        killed on his job   Heart disease Maternal Grandmother    COPD Paternal Aunt     Social History   Socioeconomic History   Marital status: Married    Spouse name: Not on file   Number of children: 2   Years of education: Not on file   Highest education level: Not on file  Occupational History    Employer: [employer]  Tobacco Use   Smoking status: Every Day    Packs/day: 0.50    Years: 40.00    Total pack years: 20.00    Types: Cigarettes   Smokeless tobacco: Former    Types: Nurse, children's Use: Never used  Substance and Sexual Activity   Alcohol use: Yes    Alcohol/week: 30.0 standard drinks of alcohol    Types: 30 Cans of beer per week    Comment: daily mostly   Drug use: Not Currently   Sexual activity: Yes    Partners: Male    Birth control/protection: None  Other Topics Concern   Not on file  Social History Narrative   One girl one boy    Social Determinants of Radio broadcast assistant Strain: Not on file  Food Insecurity: Not on file  Transportation Needs: Not on file  Physical Activity: Not on file  Stress: Not on file  Social Connections: Not on file  Intimate Partner Violence: Not on file    Outpatient Medications Prior to Visit  Medication Sig Dispense Refill   amLODipine (NORVASC) 5 MG tablet Take 1 tablet (5 mg total) by mouth daily. 90 tablet 1   atorvastatin (LIPITOR) 20 MG tablet Take  1 tablet (20 mg total) by mouth daily. 90 tablet 3   montelukast (SINGULAIR) 10 MG tablet Take 1 tablet (10 mg total) by mouth at bedtime. 90 tablet 1   nortriptyline (PAMELOR) 10 MG capsule Take 10 mg by mouth at bedtime.     Vitamin D, Ergocalciferol, (DRISDOL) 1.25 MG (50000 UNIT) CAPS capsule Take 1 capsule (50,000 Units total) by mouth every 7 (seven) days for 8 doses. 8 capsule 0   predniSONE (DELTASONE) 20 MG tablet Take 2 tablets by mouth once daily for 5 days. 10 tablet 0   No facility-administered medications prior to visit.    No Known Allergies    Review of Systems  Respiratory:  Negative for shortness of breath.   Cardiovascular:  Negative for chest pain and palpitations.  Gastrointestinal:  Negative for constipation and diarrhea.  Genitourinary:  Negative for dysuria, frequency and urgency.  Musculoskeletal:  Negative for myalgias.   Psychiatric/Behavioral:  Negative for depression and suicidal ideas.   All other systems reviewed and are negative.    Objective:    Physical Exam  Gen: NAD, resting comfortably HEENT: TMs normal bilaterally. OP clear. No thyromegaly noted.  CV: RRR with no murmurs appreciated Pulm: NWOB, CTAB with no crackles, wheezes, or rhonchi GI: Normal bowel sounds present. Soft, Nontender, Nondistended. MSK: no edema, cyanosis, or clubbing noted Skin: warm, dry Psych: Normal affect and thought content  BP 110/62   Pulse 100   Temp 98.1 F (36.7 C) (Temporal)   Ht '5\' 8"'$  (1.727 m)   Wt 131 lb (59.4 kg)   SpO2 99%   BMI 19.92 kg/m  Wt Readings from Last 3 Encounters:  01/02/22 131 lb (59.4 kg)  12/02/21 130 lb 4 oz (59.1 kg)  11/21/21 131 lb 2 oz (59.5 kg)     Health Maintenance Due  Topic Date Due   COVID-19 Vaccine (1) Never done   HIV Screening  Never done   Hepatitis C Screening  Never done   TETANUS/TDAP  Never done   MAMMOGRAM  Never done   Zoster Vaccines- Shingrix (1 of 2) Never done    There are no preventive care reminders to display for this patient.  Lab Results  Component Value Date   TSH 1.44 11/21/2021   Lab Results  Component Value Date   WBC 9.6 11/21/2021   HGB 15.9 (H) 11/21/2021   HCT 46.9 (H) 11/21/2021   MCV 87.8 11/21/2021   PLT 338.0 11/21/2021   Lab Results  Component Value Date   NA 139 11/21/2021   K 5.2 (H) 11/21/2021   CO2 32 11/21/2021   GLUCOSE 79 11/21/2021   BUN 8 11/21/2021   CREATININE 1.20 11/21/2021   BILITOT 0.4 11/21/2021   ALKPHOS 136 (H) 11/21/2021   AST 18 11/21/2021   ALT 19 11/21/2021   PROT 7.6 11/21/2021   ALBUMIN 4.7 11/21/2021   CALCIUM 10.0 11/21/2021   ANIONGAP 9 09/02/2019   GFR 52.10 (L) 11/21/2021   Lab Results  Component Value Date   CHOL 345 (H) 11/21/2021   Lab Results  Component Value Date   HDL 42.60 11/21/2021   Lab Results  Component Value Date   LDLCALC 259 (H) 03/07/2016   Lab  Results  Component Value Date   TRIG 236.0 (H) 11/21/2021   Lab Results  Component Value Date   CHOLHDL 8 11/21/2021   No results found for: "HGBA1C"    Assessment & Plan:   Problem List Items Addressed This Visit  None   No orders of the defined types were placed in this encounter.   Follow-up: No follow-ups on file.    Eugenia Pancoast, FNP

## 2022-01-06 DIAGNOSIS — E538 Deficiency of other specified B group vitamins: Secondary | ICD-10-CM | POA: Insufficient documentation

## 2022-01-06 NOTE — Assessment & Plan Note (Signed)
Recommend b12 1000 mcg once daily otc

## 2022-01-06 NOTE — Assessment & Plan Note (Signed)
Trial meloxicam 7/5 mg once daily  Heat/ice as needed  Rest as needed

## 2022-01-06 NOTE — Assessment & Plan Note (Signed)
Will repeat hepatic function panel in two months at f/u appt

## 2022-01-06 NOTE — Assessment & Plan Note (Addendum)
Continue atorvastatin 20 mg once daily  Will repeat lipid panel next visit

## 2022-01-06 NOTE — Assessment & Plan Note (Signed)
Will repeat at f/u visit

## 2022-01-30 ENCOUNTER — Other Ambulatory Visit: Payer: Self-pay | Admitting: Family

## 2022-01-30 DIAGNOSIS — M2550 Pain in unspecified joint: Secondary | ICD-10-CM

## 2022-02-28 ENCOUNTER — Other Ambulatory Visit: Payer: Self-pay | Admitting: Family

## 2022-02-28 DIAGNOSIS — M2550 Pain in unspecified joint: Secondary | ICD-10-CM

## 2022-03-21 DIAGNOSIS — Z419 Encounter for procedure for purposes other than remedying health state, unspecified: Secondary | ICD-10-CM | POA: Diagnosis not present

## 2022-04-21 DIAGNOSIS — Z419 Encounter for procedure for purposes other than remedying health state, unspecified: Secondary | ICD-10-CM | POA: Diagnosis not present

## 2022-05-19 ENCOUNTER — Encounter: Payer: Self-pay | Admitting: Family

## 2022-05-19 ENCOUNTER — Ambulatory Visit (INDEPENDENT_AMBULATORY_CARE_PROVIDER_SITE_OTHER): Payer: Medicaid Other | Admitting: Family

## 2022-05-19 VITALS — BP 142/76 | HR 71 | Temp 98.2°F | Ht 68.0 in | Wt 138.4 lb

## 2022-05-19 DIAGNOSIS — R1033 Periumbilical pain: Secondary | ICD-10-CM

## 2022-05-19 DIAGNOSIS — R232 Flushing: Secondary | ICD-10-CM

## 2022-05-19 DIAGNOSIS — R1030 Lower abdominal pain, unspecified: Secondary | ICD-10-CM

## 2022-05-19 DIAGNOSIS — R109 Unspecified abdominal pain: Secondary | ICD-10-CM | POA: Insufficient documentation

## 2022-05-19 DIAGNOSIS — I10 Essential (primary) hypertension: Secondary | ICD-10-CM | POA: Diagnosis not present

## 2022-05-19 LAB — CBC WITH DIFFERENTIAL/PLATELET
Basophils Absolute: 0.1 10*3/uL (ref 0.0–0.1)
Basophils Relative: 1 % (ref 0.0–3.0)
Eosinophils Absolute: 0.2 10*3/uL (ref 0.0–0.7)
Eosinophils Relative: 2.3 % (ref 0.0–5.0)
HCT: 43.9 % (ref 36.0–46.0)
Hemoglobin: 14.8 g/dL (ref 12.0–15.0)
Lymphocytes Relative: 34.3 % (ref 12.0–46.0)
Lymphs Abs: 3.1 10*3/uL (ref 0.7–4.0)
MCHC: 33.6 g/dL (ref 30.0–36.0)
MCV: 87.2 fl (ref 78.0–100.0)
Monocytes Absolute: 0.5 10*3/uL (ref 0.1–1.0)
Monocytes Relative: 5.8 % (ref 3.0–12.0)
Neutro Abs: 5.1 10*3/uL (ref 1.4–7.7)
Neutrophils Relative %: 56.6 % (ref 43.0–77.0)
Platelets: 286 10*3/uL (ref 150.0–400.0)
RBC: 5.04 Mil/uL (ref 3.87–5.11)
RDW: 13.2 % (ref 11.5–15.5)
WBC: 8.9 10*3/uL (ref 4.0–10.5)

## 2022-05-19 LAB — COMPREHENSIVE METABOLIC PANEL
ALT: 15 U/L (ref 0–35)
AST: 15 U/L (ref 0–37)
Albumin: 4.8 g/dL (ref 3.5–5.2)
Alkaline Phosphatase: 120 U/L — ABNORMAL HIGH (ref 39–117)
BUN: 7 mg/dL (ref 6–23)
CO2: 29 mEq/L (ref 19–32)
Calcium: 10 mg/dL (ref 8.4–10.5)
Chloride: 104 mEq/L (ref 96–112)
Creatinine, Ser: 0.92 mg/dL (ref 0.40–1.20)
GFR: 71.42 mL/min (ref >60.00)
Glucose, Bld: 58 mg/dL — ABNORMAL LOW (ref 70–99)
Potassium: 4.8 mEq/L (ref 3.5–5.1)
Sodium: 142 mEq/L (ref 135–145)
Total Bilirubin: 0.3 mg/dL (ref 0.2–1.2)
Total Protein: 7.5 g/dL (ref 6.0–8.3)

## 2022-05-19 LAB — FOLLICLE STIMULATING HORMONE: FSH: 44.5 m[IU]/mL

## 2022-05-19 MED ORDER — PANTOPRAZOLE SODIUM 40 MG PO TBEC: 40.0000 mg | DELAYED_RELEASE_TABLET | Freq: Every day | ORAL | 0 refills | Status: DC

## 2022-05-19 MED ORDER — AMLODIPINE BESYLATE 10 MG PO TABS: 10.0000 mg | ORAL_TABLET | Freq: Every day | ORAL | 0 refills | Status: DC

## 2022-05-19 NOTE — Assessment & Plan Note (Addendum)
Patient nontoxic in appearance.  Based on focal presentation of abdominal pain, pending CT abdomen pelvis to evaluate for diverticulitis, ovarian mass.  Consider pelvic US. Would also vascular ABI to screen for PAD though symptoms are not overtly consistent with PAD based on presentation today. She has h/o smoking therefore I would have a low threshold for screening.  Discussed abdominal pain may be exacerbated by constipation, GERD.  Stop omeprazole, start Protonix 40 mg every morning.  Counseled on long-term risk of PPIs.  Start Metamucil to promote better bowel health. She has follow up with Tabitha next week.

## 2022-05-19 NOTE — Patient Instructions (Signed)
Please stop omeprazole and start Protonix 40 mg taken 30 minutes to 1 hour prior to breakfast in the morning.  The timing is very important for effectiveness.  I question if some of your abdominal pain is not related to acid reflux as well as constipation  To bulk stool, you may either try Metamucil 3.4 g daily up to 3 times daily .  Or you may try Benefiber 1 tablespoon 3 times daily with meals.  Benefiber and Metamucil work in the same way. They absorb water from your intestines to form softer, bulkier stools. These stools flow more easily through your digestive system, which helps you have easier bowel movements. These supplements also increase how often you have bowel movements.  It is important to increase dietary fiber and water intake as well.   Blood pressure elevated today.  Please increase amlodipine from 5 mg daily to 10 mg daily.  I have ordered a CT abdomen and pelvis.  We will call you in the next couple days to schedule.  Please let me know if any symptoms were to change or new symptoms develop.   Food Choices for Gastroesophageal Reflux Disease, Adult When you have gastroesophageal reflux disease (GERD), the foods you eat and your eating habits are very important. Choosing the right foods can help ease the discomfort of GERD. Consider working with a dietitian to help you make healthy food choices. What are tips for following this plan? Reading food labels Look for foods that are low in saturated fat. Foods that have less than 5% of daily value (DV) of fat and 0 g of trans fats may help with your symptoms. Cooking Cook foods using methods other than frying. This may include baking, steaming, grilling, or broiling. These are all methods that do not need a lot of fat for cooking. To add flavor, try to use herbs that are low in spice and acidity. Meal planning  Choose healthy foods that are low in fat, such as fruits, vegetables, whole grains, low-fat dairy products, lean meats,  fish, and poultry. Eat frequent, small meals instead of three large meals each day. Eat your meals slowly, in a relaxed setting. Avoid bending over or lying down until 2-3 hours after eating. Limit high-fat foods such as fatty meats or fried foods. Limit your intake of fatty foods, such as oils, butter, and shortening. Avoid the following as told by your health care provider: Foods that cause symptoms. These may be different for different people. Keep a food diary to keep track of foods that cause symptoms. Alcohol. Drinking large amounts of liquid with meals. Eating meals during the 2-3 hours before bed. Lifestyle Maintain a healthy weight. Ask your health care provider what weight is healthy for you. If you need to lose weight, work with your health care provider to do so safely. Exercise for at least 30 minutes on 5 or more days each week, or as told by your health care provider. Avoid wearing clothes that fit tightly around your waist and chest. Do not use any products that contain nicotine or tobacco. These products include cigarettes, chewing tobacco, and vaping devices, such as e-cigarettes. If you need help quitting, ask your health care provider. Sleep with the head of your bed raised. Use a wedge under the mattress or blocks under the bed frame to raise the head of the bed. Chew sugar-free gum after mealtimes. What foods should I eat?  Eat a healthy, well-balanced diet of fruits, vegetables, whole grains, low-fat dairy  products, lean meats, fish, and poultry. Each person is different. Foods that may trigger symptoms in one person may not trigger any symptoms in another person. Work with your health care provider to identify foods that are safe for you. The items listed above may not be a complete list of recommended foods and beverages. Contact a dietitian for more information. What foods should I avoid? Limiting some of these foods may help manage the symptoms of GERD. Everyone is  different. Consult a dietitian or your health care provider to help you identify the exact foods to avoid, if any. Fruits Any fruits prepared with added fat. Any fruits that cause symptoms. For some people this may include citrus fruits, such as oranges, grapefruit, pineapple, and lemons. Vegetables Deep-fried vegetables. [place] fries. Any vegetables prepared with added fat. Any vegetables that cause symptoms. For some people, this may include tomatoes and tomato products, chili peppers, onions and garlic, and horseradish. Grains Pastries or quick breads with added fat. Meats and other proteins High-fat meats, such as fatty beef or pork, hot dogs, ribs, ham, sausage, salami, and bacon. Fried meat or protein, including fried fish and fried chicken. Nuts and nut butters, in large amounts. Dairy Whole milk and chocolate milk. Sour cream. Cream. Ice cream. Cream cheese. Milkshakes. Fats and oils Butter. Margarine. Shortening. Ghee. Beverages Coffee and tea, with or without caffeine. Carbonated beverages. Sodas. Energy drinks. Fruit juice made with acidic fruits, such as orange or grapefruit. Tomato juice. Alcoholic drinks. Sweets and desserts Chocolate and cocoa. Donuts. Seasonings and condiments Pepper. Peppermint and spearmint. Added salt. Any condiments, herbs, or seasonings that cause symptoms. For some people, this may include curry, hot sauce, or vinegar-based salad dressings. The items listed above may not be a complete list of foods and beverages to avoid. Contact a dietitian for more information. Questions to ask your health care provider Diet and lifestyle changes are usually the first steps that are taken to manage symptoms of GERD. If diet and lifestyle changes do not improve your symptoms, talk with your health care provider about taking medicines. Where to find more information International Foundation for Gastrointestinal Disorders: aboutgerd.org Summary When you have  gastroesophageal reflux disease (GERD), food and lifestyle choices may be very helpful in easing the discomfort of GERD. Eat frequent, small meals instead of three large meals each day. Eat your meals slowly, in a relaxed setting. Avoid bending over or lying down until 2-3 hours after eating. Limit high-fat foods such as fatty meats or fried foods. This information is not intended to replace advice given to you by your health care provider. Make sure you discuss any questions you have with your health care provider. Document Revised: 10/17/2019 Document Reviewed: 10/17/2019 Elsevier Patient Education  [place].

## 2022-05-19 NOTE — Progress Notes (Signed)
Assessment & Plan:  Lower abdominal pain Assessment & Plan: Patient nontoxic in appearance.  Based on focal presentation of abdominal pain, pending CT abdomen pelvis to evaluate for diverticulitis, ovarian mass.  Consider pelvic US. Would also vascular ABI to screen for PAD though symptoms are not overtly consistent with PAD based on presentation today. She has h/o smoking therefore I would have a low threshold for screening.  Discussed abdominal pain may be exacerbated by constipation, GERD.  Stop omeprazole, start Protonix 40 mg every morning.  Counseled on long-term risk of PPIs.  Start Metamucil to promote better bowel health. She has follow up with Tabitha next week.    Orders: -     CA 125 -     Pantoprazole Sodium; Take 1 tablet (40 mg total) by mouth daily. Further refills from pcp  Dispense: 90 tablet; Refill: 0 -     CT ABDOMEN PELVIS W CONTRAST  Essential hypertension Assessment & Plan: Uncontrolled.  Increase amlodipine from 5 mg to 10 mg daily. Close follow up.   Orders: -     amLODIPine Besylate; Take 1 tablet (10 mg total) by mouth daily.  Dispense: 90 tablet; Refill: 0  Periumbilical abdominal pain Assessment & Plan: Patient nontoxic in appearance.  Based on focal presentation of abdominal pain, pending CT abdomen pelvis to evaluate for diverticulitis, ovarian mass.  Consider pelvic US. Would also vascular ABI to screen for PAD though symptoms are not overtly consistent with PAD based on presentation today. She has h/o smoking therefore I would have a low threshold for screening.  Discussed abdominal pain may be exacerbated by constipation, GERD.  Stop omeprazole, start Protonix 40 mg every morning.  Counseled on long-term risk of PPIs.  Start Metamucil to promote better bowel health. She has follow up with Tabitha next week.    Orders: -     CT ABDOMEN PELVIS W CONTRAST  Hot flashes Assessment & Plan: Symptoms c/w menopause. Blood pressure is also elevated today.  We agreed to increase amlodipine first and then address QOL and hot flashes. Consider paxil or gabapentin. She has f/u scheduled with Tabitha.   Orders: -     Follicle stimulating hormone -     CBC with Differential/Platelet -     Comprehensive metabolic panel     Return precautions given.   Risks, benefits, and alternatives of the medications and treatment plan prescribed today were discussed, and patient expressed understanding.   Education regarding symptom management and diagnosis given to patient on AVS either electronically or printed.  Return in about 1 week (around 05/26/2022).  Mable Paris, FNP  Subjective:    Patient ID: Aviva Signs, female    DOB: 1971-02-22, 52 y.o.   MRN: [id number]  CC: BRYA SIMERLY is a 52 y.o. female who presents today for an acute visit.    HPI: Complains of umbilical to left groin 'burning' sensation. Burning and ache constant. Worse after a meal.   Started 4 months ago.   She has had weight gain.   She has BM QOD which normal for her.   No dysuria, N, V, hip pain, saddle anesthesia, low back pain  History of GERD.  Compliant with omeprazole 20 mg BID.  She will have occasional epigastric burning.   She also complains of hot flashes x for many years, since hysterectomy.   Occurs during the day and night.   No unusual bone, fever, weight loss. She is not drenching the sheets.  Colonoscopy 09/2019, polyps.  Repeat in 7 years History of abdominal hysterectomy H/o ovarian cyst   HTN- compliant with norvasc '5mg'$  qd. At bp , can range from 145-170/ 80.  No cp.    Allergies: Patient has no known allergies. Current Outpatient Medications on File Prior to Visit  Medication Sig Dispense Refill   atorvastatin (LIPITOR) 20 MG tablet Take 1 tablet (20 mg total) by mouth daily. 90 tablet 3   montelukast (SINGULAIR) 10 MG tablet Take 1 tablet (10 mg total) by mouth at bedtime. 90 tablet 1   No current facility-administered  medications on file prior to visit.    Review of Systems  Constitutional:  Negative for chills and fever.  Respiratory:  Negative for cough.   Cardiovascular:  Negative for chest pain and palpitations.  Gastrointestinal:  Positive for abdominal pain and constipation. Negative for blood in stool, nausea and vomiting.  Genitourinary:  Negative for difficulty urinating and dysuria.      Objective:    BP (!) 142/76   Pulse 71   Temp 98.2 F (36.8 C) (Oral)   Ht '5\' 8"'$  (1.727 m)   Wt 138 lb 6.4 oz (62.8 kg)   SpO2 97%   BMI 21.04 kg/m   BP Readings from Last 3 Encounters:  05/19/22 (!) 142/76  01/02/22 110/62  12/02/21 (!) 150/84   Wt Readings from Last 3 Encounters:  05/19/22 138 lb 6.4 oz (62.8 kg)  01/02/22 131 lb (59.4 kg)  12/02/21 130 lb 4 oz (59.1 kg)    Physical Exam Vitals reviewed.  Constitutional:      Appearance: Normal appearance. She is well-developed.  Eyes:     Conjunctiva/sclera: Conjunctivae normal.  Cardiovascular:     Rate and Rhythm: Normal rate and regular rhythm.     Pulses: Normal pulses.     Heart sounds: Normal heart sounds.  Pulmonary:     Effort: Pulmonary effort is normal.     Breath sounds: Normal breath sounds. No wheezing, rhonchi or rales.  Abdominal:     General: Bowel sounds are normal. There is no distension.     Palpations: Abdomen is soft. Abdomen is not rigid. There is no fluid wave or mass.     Tenderness: There is abdominal tenderness in the periumbilical area. There is no right CVA tenderness, left CVA tenderness, guarding or rebound.  Skin:    General: Skin is warm and dry.  Neurological:     Mental Status: She is alert.  Psychiatric:        Speech: Speech normal.        Behavior: Behavior normal.        Thought Content: Thought content normal.

## 2022-05-19 NOTE — Assessment & Plan Note (Addendum)
Uncontrolled.  Increase amlodipine from 5 mg to 10 mg daily. Close follow up.

## 2022-05-19 NOTE — Assessment & Plan Note (Addendum)
Symptoms c/w menopause. Blood pressure is also elevated today. We agreed to increase amlodipine first and then address QOL and hot flashes. Consider paxil or gabapentin. She has f/u scheduled with Tabitha.

## 2022-05-20 LAB — CA 125: CA 125: 12 U/mL (ref <35)

## 2022-05-21 ENCOUNTER — Other Ambulatory Visit: Payer: Self-pay | Admitting: Family

## 2022-05-21 ENCOUNTER — Other Ambulatory Visit (HOSPITAL_COMMUNITY): Payer: Self-pay

## 2022-05-21 ENCOUNTER — Encounter: Payer: Self-pay | Admitting: Family

## 2022-05-21 ENCOUNTER — Telehealth: Payer: Self-pay | Admitting: Family

## 2022-05-21 DIAGNOSIS — R1030 Lower abdominal pain, unspecified: Secondary | ICD-10-CM

## 2022-05-21 NOTE — Telephone Encounter (Signed)
Pharmacy Patient Advocate Encounter   Received notification from [place] that prior authorization for Pantoprazole Sodium '40MG'$  dr tablets is required/requested.  Per Test Claim: PA required   PA submitted on 05/21/22 to (ins) [place] [employer] via CoverMyMeds [employer]  [facility] Status is pending

## 2022-05-21 NOTE — Telephone Encounter (Signed)
Pt called in staying that she is a [facility] pt, however she saw Arnett Monday and she prescribed her pantoprazole (PROTONIX) 40 MG tablet, and when [facility] told her that she needs PA for the med. She's having a CT scan tomorrow. She's available '@[phone]'$ [id number]

## 2022-05-22 ENCOUNTER — Ambulatory Visit: Payer: Medicaid Other

## 2022-05-22 ENCOUNTER — Ambulatory Visit
Admission: RE | Admit: 2022-05-22 | Discharge: 2022-05-22 | Disposition: A | Discharge status: Home / Self Care | Payer: Medicaid Other | Source: Ambulatory Visit | Attending: Family | Admitting: Family

## 2022-05-22 DIAGNOSIS — R1030 Lower abdominal pain, unspecified: Secondary | ICD-10-CM | POA: Diagnosis not present

## 2022-05-22 DIAGNOSIS — Z90710 Acquired absence of both cervix and uterus: Secondary | ICD-10-CM | POA: Diagnosis not present

## 2022-05-22 DIAGNOSIS — Z419 Encounter for procedure for purposes other than remedying health state, unspecified: Secondary | ICD-10-CM | POA: Diagnosis not present

## 2022-05-22 DIAGNOSIS — R1033 Periumbilical pain: Secondary | ICD-10-CM | POA: Diagnosis not present

## 2022-05-22 DIAGNOSIS — R1032 Left lower quadrant pain: Secondary | ICD-10-CM | POA: Diagnosis not present

## 2022-05-23 NOTE — Telephone Encounter (Addendum)
Pharmacy Patient Advocate Encounter  Received notification from [facility] that the request for prior authorization for Pantoprazole Sodium '40MG'$  dr tablets has been denied due to not meeting the definition of medical necessity.      Please be advised we currently do not have a Pharmacist to review denials, therefore you will need to process appeals accordingly as needed. Thanks for your support at this time.   You may call [phone] extension [phone] or fax [phone], to appeal.  Denial letter attached to charts

## 2022-05-23 NOTE — Telephone Encounter (Signed)
Pt has never tried Nexium Will pick up  Will keep [place] updated on how she is doing on it  If fails, will re-apply for coverage of Pantoprazole

## 2022-05-26 ENCOUNTER — Ambulatory Visit: Payer: Medicaid Other | Admitting: Family

## 2022-05-26 NOTE — Telephone Encounter (Signed)
Noted  

## 2022-05-26 NOTE — Progress Notes (Signed)
noted 

## 2022-05-26 NOTE — Progress Notes (Signed)
Thank you so much for seeing her and for your recommendations. I appreciate it.

## 2022-05-27 ENCOUNTER — Encounter: Payer: Self-pay | Admitting: Family

## 2022-05-27 ENCOUNTER — Ambulatory Visit (INDEPENDENT_AMBULATORY_CARE_PROVIDER_SITE_OTHER): Payer: BLUE CROSS/BLUE SHIELD | Admitting: Family

## 2022-05-27 ENCOUNTER — Other Ambulatory Visit: Payer: Self-pay | Admitting: Family

## 2022-05-27 VITALS — BP 132/84 | HR 90 | Temp 98.8°F | Ht 68.0 in | Wt 140.6 lb

## 2022-05-27 DIAGNOSIS — R1032 Left lower quadrant pain: Secondary | ICD-10-CM | POA: Diagnosis not present

## 2022-05-27 DIAGNOSIS — R10819 Abdominal tenderness, unspecified site: Secondary | ICD-10-CM

## 2022-05-27 DIAGNOSIS — K219 Gastro-esophageal reflux disease without esophagitis: Secondary | ICD-10-CM | POA: Diagnosis not present

## 2022-05-27 DIAGNOSIS — R232 Flushing: Secondary | ICD-10-CM | POA: Diagnosis not present

## 2022-05-27 DIAGNOSIS — R1030 Lower abdominal pain, unspecified: Secondary | ICD-10-CM

## 2022-05-27 LAB — POCT URINALYSIS DIPSTICK
Bilirubin, UA: NEGATIVE
Blood, UA: NEGATIVE
Glucose, UA: NEGATIVE
Ketones, UA: NEGATIVE
Leukocytes, UA: NEGATIVE
Nitrite, UA: NEGATIVE
Protein, UA: NEGATIVE
Spec Grav, UA: 1.01 (ref 1.010–1.025)
Urobilinogen, UA: 0.2 E.U./dL
pH, UA: 6 (ref 5.0–8.0)

## 2022-05-27 MED ORDER — DEXLANSOPRAZOLE 30 MG PO CPDR: 30.0000 mg | DELAYED_RELEASE_CAPSULE | Freq: Every day | ORAL | 0 refills | Status: DC

## 2022-05-27 MED ORDER — PAROXETINE HCL 10 MG PO TABS: 10.0000 mg | ORAL_TABLET | Freq: Every day | ORAL | 0 refills | Status: DC

## 2022-05-27 NOTE — Addendum Note (Signed)
Addended by: Vaughan Browner on: 05/27/2022 11:19 AM   Modules accepted: Orders

## 2022-05-27 NOTE — Telephone Encounter (Signed)
Can we do prior auth?  Failed omeprazole.  Insurance didn't cover pantoprazole and or lansoprazole.

## 2022-05-27 NOTE — Telephone Encounter (Signed)
PA submitted on CMM with (Key: [id number])

## 2022-05-27 NOTE — Progress Notes (Signed)
Established Patient Office Visit  Subjective:      CC:  Chief Complaint  Patient presents with   Hot Flashes    HPI: Haley Hester is a 52 y.o. female presenting on 05/27/2022 for Hot Flashes .  Recently saw Avie Arenas, NP for c/o lower abdominal pain, and c/o umbilical to left groin burning sensation. Worse after a meal with some abn weight gain.  Was advised to start on trial pantoprazole 40 mg once daily however she states was not covered by insurance, and ran out of omeprazole (two days ago) , stopped omeprazole. Advised to use metamucil as well. U/s was performed with normal appearance of right ovary, left ovary was no visualized.   New complaints: She states the pain is still ongoing. Starts midline to left lower abdominal quadrant and at times will have a burning sensation. At times she will feel as though the pain is inside her belly button, and it is 'pulling and burning at the same time'. Bowel movements are qod and this is normal for her. No nausea. Te pain is constant everyday, someday's will back off and not be as painful other times comes back pretty tender.   Denies dysuria, frequency and or urgency. Some stress incontinence. Denies vaginal discharge. Wakes up 1-2 times a night to pee.   Last metabolic panel Lab Results  Component Value Date   GLUCOSE 58 (L) 05/19/2022   NA 142 05/19/2022   K 4.8 05/19/2022   CL 104 05/19/2022   CO2 29 05/19/2022   BUN 7 05/19/2022   CREATININE 0.92 05/19/2022   GFRNONAA >60 09/02/2019   CALCIUM 10.0 05/19/2022   PROT 7.5 05/19/2022   ALBUMIN 4.8 05/19/2022   BILITOT 0.3 05/19/2022   ALKPHOS 120 (H) 05/19/2022   AST 15 05/19/2022   ALT 15 05/19/2022   ANIONGAP 9 09/02/2019       Social history:  Relevant past medical, surgical, family and social history reviewed and updated as indicated. Interim medical history since our last visit reviewed.  Allergies and medications reviewed and updated.  DATA  REVIEWED: CHART IN EPIC     ROS: Negative unless specifically indicated above in HPI.    Current Outpatient Medications:    amLODipine (NORVASC) 10 MG tablet, Take 1 tablet (10 mg total) by mouth daily., Disp: 90 tablet, Rfl: 0   atorvastatin (LIPITOR) 20 MG tablet, Take 1 tablet (20 mg total) by mouth daily., Disp: 90 tablet, Rfl: 3   Dexlansoprazole 30 MG capsule DR, Take 1 capsule (30 mg total) by mouth daily., Disp: 90 capsule, Rfl: 0   montelukast (SINGULAIR) 10 MG tablet, Take 1 tablet (10 mg total) by mouth at bedtime., Disp: 90 tablet, Rfl: 1   PARoxetine (PAXIL) 10 MG tablet, Take 1 tablet (10 mg total) by mouth daily., Disp: 90 tablet, Rfl: 0      Objective:    BP 132/84   Pulse 90   Temp 98.8 F (37.1 C) (Temporal)   Ht '5\' 8"'$  (1.727 m)   Wt 140 lb 9.6 oz (63.8 kg)   SpO2 98%   BMI 21.38 kg/m   Wt Readings from Last 3 Encounters:  05/27/22 140 lb 9.6 oz (63.8 kg)  05/19/22 138 lb 6.4 oz (62.8 kg)  01/02/22 131 lb (59.4 kg)    Physical Exam Constitutional:      General: She is not in acute distress.    Appearance: Normal appearance. She is normal weight. She is not ill-appearing, toxic-appearing or  diaphoretic.  HENT:     Head: Normocephalic.  Cardiovascular:     Rate and Rhythm: Normal rate.  Pulmonary:     Effort: Pulmonary effort is normal.  Abdominal:     General: There is distension (suprapubic area).     Tenderness: There is abdominal tenderness in the right lower quadrant, epigastric area and left lower quadrant. There is no guarding. Negative signs include Murphy's sign and McBurney's sign.     Comments: Epigastric very slight  Llq >rlq  Musculoskeletal:        General: Normal range of motion.  Neurological:     General: No focal deficit present.     Mental Status: She is alert and oriented to person, place, and time. Mental status is at baseline.  Psychiatric:        Mood and Affect: Mood normal.        Behavior: Behavior normal.         Thought Content: Thought content normal.        Judgment: Judgment normal.            Assessment & Plan:  Gastroesophageal reflux disease, unspecified whether esophagitis present Assessment & Plan: Stop omeprazole start dexilant  Try to decrease and or avoid spicy foods, fried fatty foods, and also caffeine and chocolate as these can increase heartburn symptoms.    Orders: -     Dexlansoprazole; Take 1 capsule (30 mg total) by mouth daily.  Dispense: 90 capsule; Refill: 0  Lower abdominal pain Assessment & Plan: Llq abdominal pain, ct abd pelvis  R/o diverticulitis, ovarian cyst, other etiology Urine culture today pending results to r/o uti   Orders: -     POCT Urinalysis Dipstick (Automated)  Left lower quadrant abdominal pain Assessment & Plan: Llq abdominal pain, ct abd pelvis  R/o diverticulitis, ovarian cyst, other etiology Urine culture today pending results to r/o uti   Orders: -     CT ABDOMEN PELVIS W CONTRAST; Future -     Urine Culture  Suprapubic tenderness -     Urine Culture  Hot flashes Assessment & Plan: Trial paxil 10 mg once daily   Orders: -     PARoxetine HCl; Take 1 tablet (10 mg total) by mouth daily.  Dispense: 90 tablet; Refill: 0     Return in about 2 weeks (around 06/10/2022).  Eugenia Pancoast, MSN, APRN, FNP-C [place]

## 2022-05-27 NOTE — Patient Instructions (Addendum)
Stop omeprazole Start dexilant   Trial paxil 10 mg once daily for hot flashes    Regards,   Heywood Tokunaga FNP-C

## 2022-05-27 NOTE — Assessment & Plan Note (Signed)
Llq abdominal pain, ct abd pelvis  R/o diverticulitis, ovarian cyst, other etiology Urine culture today pending results to r/o uti

## 2022-05-27 NOTE — Assessment & Plan Note (Signed)
Trial paxil 10 mg once daily

## 2022-05-27 NOTE — Assessment & Plan Note (Signed)
Stop omeprazole start dexilant  Try to decrease and or avoid spicy foods, fried fatty foods, and also caffeine and chocolate as these can increase heartburn symptoms.

## 2022-05-28 ENCOUNTER — Ambulatory Visit: Payer: Medicaid Other

## 2022-05-28 LAB — URINE CULTURE
MICRO NUMBER:: 14525832
Result:: NO GROWTH
SPECIMEN QUALITY:: ADEQUATE

## 2022-05-28 NOTE — Telephone Encounter (Signed)
Checked covermymeds and PA is still pending.

## 2022-05-29 NOTE — Progress Notes (Signed)
Urine culture negative for UTI

## 2022-06-02 ENCOUNTER — Encounter: Payer: Self-pay | Admitting: *Deleted

## 2022-06-03 NOTE — Telephone Encounter (Addendum)
Pharmacy Patient Advocate Encounter  Received notification from [facility] that the request for prior authorization for Dexlansoprazole has been denied due to .    Please be advised we currently do not have a Pharmacist to review denials, therefore you will need to process appeals accordingly as needed. Thanks for your support at this time.   If appeal is needed, please see below:

## 2022-06-04 NOTE — Telephone Encounter (Signed)
PA key# [id number] on covermymeds was denied. Denial states covered medication as step therapy is generic pantoprazole that must be tried and failed first.

## 2022-06-05 ENCOUNTER — Other Ambulatory Visit: Payer: Self-pay | Admitting: Family

## 2022-06-05 DIAGNOSIS — K219 Gastro-esophageal reflux disease without esophagitis: Secondary | ICD-10-CM

## 2022-06-05 MED ORDER — PANTOPRAZOLE SODIUM 20 MG PO TBEC: 20.0000 mg | DELAYED_RELEASE_TABLET | Freq: Every day | ORAL | 0 refills | Status: DC

## 2022-06-10 ENCOUNTER — Ambulatory Visit: Admission: RE | Admit: 2022-06-10 | Payer: Medicaid Other | Source: Ambulatory Visit

## 2022-06-11 ENCOUNTER — Other Ambulatory Visit (HOSPITAL_COMMUNITY): Payer: Self-pay

## 2022-06-18 NOTE — Telephone Encounter (Signed)
Dexlansoprazole PA Approval

## 2022-06-20 ENCOUNTER — Ambulatory Visit: Admission: RE | Admit: 2022-06-20 | Payer: Medicaid Other | Source: Ambulatory Visit

## 2022-06-20 DIAGNOSIS — Z419 Encounter for procedure for purposes other than remedying health state, unspecified: Secondary | ICD-10-CM | POA: Diagnosis not present

## 2022-07-09 ENCOUNTER — Ambulatory Visit
Admission: RE | Admit: 2022-07-09 | Discharge: 2022-07-09 | Disposition: A | Discharge status: Home / Self Care | Payer: Medicaid Other | Source: Ambulatory Visit | Attending: Family | Admitting: Family

## 2022-07-09 DIAGNOSIS — R1032 Left lower quadrant pain: Secondary | ICD-10-CM | POA: Diagnosis not present

## 2022-07-09 DIAGNOSIS — I700 Atherosclerosis of aorta: Secondary | ICD-10-CM | POA: Diagnosis not present

## 2022-07-09 MED ORDER — IOHEXOL 300 MG/ML  SOLN
100.0000 mL | Freq: Once | INTRAMUSCULAR | Status: AC | PRN
  Administered 2022-07-09: 100 mL via INTRAVENOUS

## 2022-07-09 NOTE — Progress Notes (Signed)
Have pt come in to discus results further as this was ordered back in beginning of February would like to know current symptoms if any and also order some more labs/urine.

## 2022-07-10 ENCOUNTER — Encounter: Payer: Self-pay | Admitting: Family

## 2022-07-10 ENCOUNTER — Ambulatory Visit (INDEPENDENT_AMBULATORY_CARE_PROVIDER_SITE_OTHER): Payer: Medicaid Other | Admitting: Family

## 2022-07-10 VITALS — BP 130/82 | HR 100 | Temp 97.8°F | Ht 68.0 in | Wt 141.4 lb

## 2022-07-10 DIAGNOSIS — R748 Abnormal levels of other serum enzymes: Secondary | ICD-10-CM

## 2022-07-10 DIAGNOSIS — K219 Gastro-esophageal reflux disease without esophagitis: Secondary | ICD-10-CM | POA: Diagnosis not present

## 2022-07-10 DIAGNOSIS — R1032 Left lower quadrant pain: Secondary | ICD-10-CM | POA: Diagnosis not present

## 2022-07-10 DIAGNOSIS — R93429 Abnormal radiologic findings on diagnostic imaging of unspecified kidney: Secondary | ICD-10-CM

## 2022-07-10 LAB — COMPREHENSIVE METABOLIC PANEL
ALT: 35 U/L (ref 0–35)
AST: 31 U/L (ref 0–37)
Albumin: 4.8 g/dL (ref 3.5–5.2)
Alkaline Phosphatase: 147 U/L — ABNORMAL HIGH (ref 39–117)
BUN: 6 mg/dL (ref 6–23)
CO2: 26 mEq/L (ref 19–32)
Calcium: 10.2 mg/dL (ref 8.4–10.5)
Chloride: 100 mEq/L (ref 96–112)
Creatinine, Ser: 0.66 mg/dL (ref 0.40–1.20)
GFR: 100.46 mL/min (ref >60.00)
Glucose, Bld: 92 mg/dL (ref 70–99)
Potassium: 3.5 mEq/L (ref 3.5–5.1)
Sodium: 137 mEq/L (ref 135–145)
Total Bilirubin: 0.5 mg/dL (ref 0.2–1.2)
Total Protein: 8.1 g/dL (ref 6.0–8.3)

## 2022-07-10 LAB — CBC WITH DIFFERENTIAL/PLATELET
Basophils Absolute: 0.1 10*3/uL (ref 0.0–0.1)
Basophils Relative: 0.6 % (ref 0.0–3.0)
Eosinophils Absolute: 0.1 10*3/uL (ref 0.0–0.7)
Eosinophils Relative: 1.4 % (ref 0.0–5.0)
HCT: 42.8 % (ref 36.0–46.0)
Hemoglobin: 14.7 g/dL (ref 12.0–15.0)
Lymphocytes Relative: 23.3 % (ref 12.0–46.0)
Lymphs Abs: 2.5 10*3/uL (ref 0.7–4.0)
MCHC: 34.3 g/dL (ref 30.0–36.0)
MCV: 85.1 fl (ref 78.0–100.0)
Monocytes Absolute: 0.7 10*3/uL (ref 0.1–1.0)
Monocytes Relative: 6.8 % (ref 3.0–12.0)
Neutro Abs: 7.2 10*3/uL (ref 1.4–7.7)
Neutrophils Relative %: 67.9 % (ref 43.0–77.0)
Platelets: 363 10*3/uL (ref 150.0–400.0)
RBC: 5.04 Mil/uL (ref 3.87–5.11)
RDW: 13.3 % (ref 11.5–15.5)
WBC: 10.7 10*3/uL — ABNORMAL HIGH (ref 4.0–10.5)

## 2022-07-10 LAB — POC URINALSYSI DIPSTICK (AUTOMATED)
Blood, UA: NEGATIVE
Glucose, UA: NEGATIVE
Ketones, UA: POSITIVE
Leukocytes, UA: NEGATIVE
Nitrite, UA: NEGATIVE
Protein, UA: POSITIVE — AB
Spec Grav, UA: 1.02 (ref 1.010–1.025)
Urobilinogen, UA: 0.2 E.U./dL
pH, UA: 5.5 (ref 5.0–8.0)

## 2022-07-10 MED ORDER — DEXLANSOPRAZOLE 30 MG PO CPDR: 30.0000 mg | DELAYED_RELEASE_CAPSULE | Freq: Every day | ORAL | 2 refills | Status: DC

## 2022-07-10 NOTE — Progress Notes (Signed)
Established Patient Office Visit  Subjective:      CC:  Chief Complaint  Patient presents with   Results    CT, needs a rx sent for Dexlansopraozole- PA was approved.   Rash    On left side, started this morning.    HPI: Haley Hester is a 52 y.o. female presenting on 07/10/2022 for Results (CT, needs a rx sent for Dexlansopraozole- PA was approved.) and Rash (On left side, started this morning.) . Ct abd pelvis: suggested slight hypo-enhancement of the lower pole right kidney could be suggestive of pyelonephritis, pt denies urinary symptoms and or flank pain.  Aortic atherosclerosis noted on CT as well.   U/s pelvic completed with transvaginal, negative findings 05/22/22.   new complaints: Pt does state that yesterday with burning sensation in her left lower abdomen and occasional pulling sensation on her belly button again from the inside. Denies bowel changes, but does often still feel bloated. Appetite is good. Heartburn comes and goes. She is on protonix 20 mg once daily.    Still having trouble falling asleep at night, lays in bed without being able to drift off. She even turns off the tv at night not to overly stimulate.  Has tried melatonin but this doesn't really seem to help her.   Social history:  Relevant past medical, surgical, family and social history reviewed and updated as indicated. Interim medical history since our last visit reviewed.  Allergies and medications reviewed and updated.  DATA REVIEWED: CHART IN EPIC     ROS: Negative unless specifically indicated above in HPI.    Current Outpatient Medications:    amLODipine (NORVASC) 10 MG tablet, Take 1 tablet (10 mg total) by mouth daily., Disp: 90 tablet, Rfl: 0   atorvastatin (LIPITOR) 20 MG tablet, Take 1 tablet (20 mg total) by mouth daily., Disp: 90 tablet, Rfl: 3   Dexlansoprazole (DEXILANT) 30 MG capsule DR, Take 1 capsule (30 mg total) by mouth daily., Disp: 30 capsule, Rfl: 2    montelukast (SINGULAIR) 10 MG tablet, Take 1 tablet (10 mg total) by mouth at bedtime., Disp: 90 tablet, Rfl: 1   PARoxetine (PAXIL) 10 MG tablet, Take 1 tablet (10 mg total) by mouth daily., Disp: 90 tablet, Rfl: 0   valACYclovir (VALTREX) 1000 MG tablet, Take 1 tablet (1,000 mg total) by mouth 3 (three) times daily for 7 days., Disp: 21 tablet, Rfl: 0      Objective:    BP 130/82   Pulse 100   Temp 97.8 F (36.6 C) (Temporal)   Ht 5\' 8"  (1.727 m)   Wt 141 lb 6.4 oz (64.1 kg)   SpO2 98%   BMI 21.50 kg/m   Wt Readings from Last 3 Encounters:  07/10/22 141 lb 6.4 oz (64.1 kg)  05/27/22 140 lb 9.6 oz (63.8 kg)  05/19/22 138 lb 6.4 oz (62.8 kg)    Physical Exam Constitutional:      General: She is not in acute distress.    Appearance: Normal appearance. She is normal weight. She is not ill-appearing, toxic-appearing or diaphoretic.  Cardiovascular:     Rate and Rhythm: Normal rate.  Pulmonary:     Effort: Pulmonary effort is normal.  Abdominal:     General: Abdomen is flat.     Tenderness: There is abdominal tenderness in the left lower quadrant. There is no right CVA tenderness or left CVA tenderness.     Hernia: No hernia is present.  Comments: Rash on left lower abdomen lateral side and also directly lateral to left side of umbillicus with erythema and warm to touch   Still light tenderness to palpation llq abdomen no rebound  Neurological:     General: No focal deficit present.     Mental Status: She is alert and oriented to person, place, and time. Mental status is at baseline.     Motor: No weakness.  Psychiatric:        Mood and Affect: Mood normal.        Behavior: Behavior normal.        Thought Content: Thought content normal.        Judgment: Judgment normal.            Assessment & Plan:  Elevated alkaline phosphatase level Assessment & Plan: Ongoing with llq abdominal pain , unremarkable CT abd pelvis  Still with abdominal distention    Orders: -     Ambulatory referral to Gastroenterology -     Comprehensive metabolic panel  Gastroesophageal reflux disease, unspecified whether esophagitis present Assessment & Plan: Trial dexilant Try to decrease and or avoid spicy foods, fried fatty foods, and also caffeine and chocolate as these can increase heartburn symptoms.    Orders: -     Dexlansoprazole; Take 1 capsule (30 mg total) by mouth daily.  Dispense: 30 capsule; Refill: 2 -     Ambulatory referral to Gastroenterology  Left lower quadrant abdominal pain -     Ambulatory referral to Gastroenterology -     Comprehensive metabolic panel -     CBC with Differential/Platelet -     POCT Urinalysis Dipstick (Automated) -     Urine Culture  Abnormal CT scan, kidney -     Comprehensive metabolic panel -     CBC with Differential/Platelet -     POCT Urinalysis Dipstick (Automated)     Return in about 3 months (around 10/10/2022) for f/u abdominal pain .  Eugenia Pancoast, MSN, APRN, FNP-C [place]

## 2022-07-11 ENCOUNTER — Telehealth: Payer: Self-pay | Admitting: Family

## 2022-07-11 DIAGNOSIS — B027 Disseminated zoster: Secondary | ICD-10-CM

## 2022-07-11 LAB — URINE CULTURE
MICRO NUMBER:: 14723782
SPECIMEN QUALITY:: ADEQUATE

## 2022-07-11 MED ORDER — VALACYCLOVIR HCL 1 G PO TABS: 1000.0000 mg | ORAL_TABLET | Freq: Three times a day (TID) | ORAL | 0 refills | Status: AC

## 2022-07-11 NOTE — Telephone Encounter (Signed)
Pt states she noticed the blisters early this morning. Pt thinks she got burned from the imaging machine. She said it feels like a water blister from the sun or sunburn. She has been taking benadryl. Please advise on recommendations.

## 2022-07-11 NOTE — Telephone Encounter (Signed)
Sounds like shingles, sending in valtrex 1 g po tid for 7 days. Called and spoke with pt advised her to monitor for s/s infection.

## 2022-07-11 NOTE — Telephone Encounter (Signed)
Calling back about rash spoke to about yesterday,  Allergy medicine over the counter, taken 2 allergy pills this morning, took 4 total yesterday(2 doses), understands it takes allergy medicine a bit to get into her system   Now blistering, burn feeling next to hip, one big blister through nightgown right under left armpit, hurts to wear pants and underwear, smaller blisters around to back  If appt, will need to be after 1pm, or okay with coming Monday if that is okay  Please follow-up with patient

## 2022-07-14 NOTE — Assessment & Plan Note (Signed)
Ongoing with llq abdominal pain , unremarkable CT abd pelvis  Still with abdominal distention

## 2022-07-14 NOTE — Assessment & Plan Note (Signed)
Trial dexilant Try to decrease and or avoid spicy foods, fried fatty foods, and also caffeine and chocolate as these can increase heartburn symptoms.

## 2022-07-17 ENCOUNTER — Telehealth: Payer: Self-pay

## 2022-07-17 NOTE — Telephone Encounter (Signed)
[facility] is telling her Dexilant is not approved just want to verify has been

## 2022-07-17 NOTE — Telephone Encounter (Signed)
Left a message per DPR, advising Tabitha's response.

## 2022-07-17 NOTE — Telephone Encounter (Signed)
Patient just got over shingles wanted to know when she can get shingles vaccination.

## 2022-07-17 NOTE — Telephone Encounter (Signed)
>  6 months after symptoms resolve

## 2022-07-18 ENCOUNTER — Other Ambulatory Visit (HOSPITAL_COMMUNITY): Payer: Self-pay

## 2022-07-21 ENCOUNTER — Telehealth: Payer: Self-pay | Admitting: Family

## 2022-07-21 DIAGNOSIS — Z419 Encounter for procedure for purposes other than remedying health state, unspecified: Secondary | ICD-10-CM | POA: Diagnosis not present

## 2022-07-21 DIAGNOSIS — Z91030 Bee allergy status: Secondary | ICD-10-CM

## 2022-07-21 NOTE — Telephone Encounter (Signed)
Patient called in and stated that the pharmacy is stating that the Dexlansoprazole ([place]) 30 MG capsule DR hasn't been approved yet. She was wanting to know what's going on. 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 Please advise. Thank you!

## 2022-07-22 MED ORDER — EPINEPHRINE 0.3 MG/0.3ML IJ SOAJ: 0.3000 mg | INTRAMUSCULAR | 0 refills | Status: AC | PRN

## 2022-07-22 NOTE — Telephone Encounter (Signed)
Could we verify if this PA for Dexilant was approval via peer to peer? Pharmacy is saying its not approved. Thanks

## 2022-07-22 NOTE — Telephone Encounter (Signed)
Patient states she has an allergic reaction to [employer] and needs an epi-pen sent to the pharmacy. She has had an epi-pen in the past, and needs one on hand. Pharmacy is correct and allergies has been updated.

## 2022-07-22 NOTE — Telephone Encounter (Signed)
Left a message per DPR advising the instructions for the Epi-pen. Still waiting to here back from our PA dept regarding the [place].

## 2022-07-22 NOTE — Addendum Note (Signed)
Addended by: Michela Pitcher on: 07/22/2022 02:08 PM   Modules accepted: Orders

## 2022-07-22 NOTE — Telephone Encounter (Signed)
I will send in Epi pen. This is to be used in severe allergic reactions only. Like difficulty breathing, swallowing, or swelling of the lips or face. If she uses the medication then she will need to be seen in the emergency department via 911 after administration   You also put that the bee sting is a low or mild allergy. She needs to understand that this is not to be used in that situation

## 2022-07-24 ENCOUNTER — Other Ambulatory Visit (HOSPITAL_COMMUNITY): Payer: Self-pay

## 2022-08-01 ENCOUNTER — Other Ambulatory Visit (HOSPITAL_COMMUNITY): Payer: Self-pay

## 2022-08-04 ENCOUNTER — Other Ambulatory Visit (HOSPITAL_COMMUNITY): Payer: Self-pay

## 2022-08-04 NOTE — Telephone Encounter (Signed)
Pharmacy Patient Advocate Encounter   Received notification that prior authorization for Dexilant 30mg  is required/requested.    PA submitted on 08/04/22 to (ins) [facility] Medicaid via CoverMyMeds Key or (Medicaid) confirmation # [id number] Status is pending

## 2022-08-07 ENCOUNTER — Telehealth: Payer: Self-pay

## 2022-08-07 NOTE — Telephone Encounter (Signed)
PA approved, documenting in separate encounter

## 2022-08-07 NOTE — Telephone Encounter (Signed)
Pharmacy Patient Advocate Encounter  Prior Authorization for Dexilant has been approved by [facility] (ins).    PA # [id number]

## 2022-08-08 NOTE — Telephone Encounter (Signed)
I spoke with the pharmacy and confirmed the Dexilant was approved and copay is $4. Sent to [employer].

## 2022-08-11 ENCOUNTER — Other Ambulatory Visit: Payer: Self-pay | Admitting: Family

## 2022-08-11 DIAGNOSIS — R232 Flushing: Secondary | ICD-10-CM

## 2022-08-11 DIAGNOSIS — I10 Essential (primary) hypertension: Secondary | ICD-10-CM

## 2022-08-11 DIAGNOSIS — J0110 Acute frontal sinusitis, unspecified: Secondary | ICD-10-CM

## 2022-08-11 NOTE — Telephone Encounter (Signed)
Refills for Amlodipine ; Montelukast , Paroxetine .  Amlodipine: LR 05/19/22 ( 90 tab / no refills) Montelukast: LR 12/02/21 ( 90 tab/ 1 refill) Paroxetine: LR- 06/16/22 ( 90 tab/ no refills)  LV- 07/10/22  NV- 08/13/22 canceled

## 2022-08-13 ENCOUNTER — Ambulatory Visit: Payer: Medicaid Other | Admitting: Family

## 2022-08-20 ENCOUNTER — Encounter: Payer: Self-pay | Admitting: Emergency Medicine

## 2022-08-20 ENCOUNTER — Emergency Department: Payer: Medicaid Other

## 2022-08-20 ENCOUNTER — Emergency Department
Admission: EM | Admit: 2022-08-20 | Discharge: 2022-08-21 | Disposition: A | Discharge status: Home / Self Care | Payer: Medicaid Other | Attending: Emergency Medicine | Admitting: Emergency Medicine

## 2022-08-20 DIAGNOSIS — R09A2 Foreign body sensation, throat: Secondary | ICD-10-CM

## 2022-08-20 DIAGNOSIS — J029 Acute pharyngitis, unspecified: Secondary | ICD-10-CM

## 2022-08-20 DIAGNOSIS — F458 Other somatoform disorders: Secondary | ICD-10-CM | POA: Diagnosis not present

## 2022-08-20 DIAGNOSIS — E876 Hypokalemia: Secondary | ICD-10-CM

## 2022-08-20 DIAGNOSIS — J439 Emphysema, unspecified: Secondary | ICD-10-CM | POA: Diagnosis not present

## 2022-08-20 DIAGNOSIS — Z419 Encounter for procedure for purposes other than remedying health state, unspecified: Secondary | ICD-10-CM | POA: Diagnosis not present

## 2022-08-20 LAB — CBC WITH DIFFERENTIAL/PLATELET
Abs Immature Granulocytes: 0.08 10*3/uL — ABNORMAL HIGH (ref 0.00–0.07)
Basophils Absolute: 0.1 10*3/uL (ref 0.0–0.1)
Basophils Relative: 1 %
Eosinophils Absolute: 0.2 10*3/uL (ref 0.0–0.5)
Eosinophils Relative: 1 %
HCT: 38.1 % (ref 36.0–46.0)
Hemoglobin: 13 g/dL (ref 12.0–15.0)
Immature Granulocytes: 1 %
Lymphocytes Relative: 19 %
Lymphs Abs: 2.4 10*3/uL (ref 0.7–4.0)
MCH: 28.4 pg (ref 26.0–34.0)
MCHC: 34.1 g/dL (ref 30.0–36.0)
MCV: 83.2 fL (ref 80.0–100.0)
Monocytes Absolute: 0.9 10*3/uL (ref 0.1–1.0)
Monocytes Relative: 7 %
Neutro Abs: 9.2 10*3/uL — ABNORMAL HIGH (ref 1.7–7.7)
Neutrophils Relative %: 71 %
Platelets: 295 10*3/uL (ref 150–400)
RBC: 4.58 MIL/uL (ref 3.87–5.11)
RDW: 13 % (ref 11.5–15.5)
WBC: 12.8 10*3/uL — ABNORMAL HIGH (ref 4.0–10.5)
nRBC: 0 % (ref 0.0–0.2)

## 2022-08-20 LAB — COMPREHENSIVE METABOLIC PANEL
ALT: 23 U/L (ref 0–44)
AST: 25 U/L (ref 15–41)
Albumin: 4 g/dL (ref 3.5–5.0)
Alkaline Phosphatase: 134 U/L — ABNORMAL HIGH (ref 38–126)
Anion gap: 16 — ABNORMAL HIGH (ref 5–15)
BUN: <5 mg/dL — ABNORMAL LOW (ref 6–20)
CO2: 20 mmol/L — ABNORMAL LOW (ref 22–32)
Calcium: 9 mg/dL (ref 8.9–10.3)
Chloride: 98 mmol/L (ref 98–111)
Creatinine, Ser: 0.52 mg/dL (ref 0.44–1.00)
GFR, Estimated: >60 mL/min (ref >60)
Glucose, Bld: 112 mg/dL — ABNORMAL HIGH (ref 70–99)
Potassium: 2.7 mmol/L — CL (ref 3.5–5.1)
Sodium: 134 mmol/L — ABNORMAL LOW (ref 135–145)
Total Bilirubin: 0.6 mg/dL (ref 0.3–1.2)
Total Protein: 7.6 g/dL (ref 6.5–8.1)

## 2022-08-20 MED ORDER — METHYLPREDNISOLONE SODIUM SUCC 40 MG IJ SOLR: 40.0000 mg | Freq: Once | INTRAMUSCULAR | Status: DC

## 2022-08-20 MED ORDER — SODIUM CHLORIDE 0.9 % IV BOLUS
1000.0000 mL | Freq: Once | INTRAVENOUS | Status: AC
  Administered 2022-08-21: 1000 mL via INTRAVENOUS

## 2022-08-20 MED ORDER — POTASSIUM CHLORIDE 20 MEQ PO PACK
40.0000 meq | PACK | ORAL | Status: AC
  Administered 2022-08-21: 40 meq via ORAL
  Filled 2022-08-20: qty 2

## 2022-08-20 MED ORDER — IOHEXOL 300 MG/ML  SOLN
75.0000 mL | Freq: Once | INTRAMUSCULAR | Status: AC | PRN
  Administered 2022-08-20: 75 mL via INTRAVENOUS

## 2022-08-20 MED ORDER — DIPHENHYDRAMINE HCL 50 MG/ML IJ SOLN: 50.0000 mg | Freq: Once | INTRAMUSCULAR | Status: DC

## 2022-08-20 MED ORDER — POTASSIUM CHLORIDE 10 MEQ/100ML IV SOLN
10.0000 meq | Freq: Once | INTRAVENOUS | Status: AC
  Administered 2022-08-21: 10 meq via INTRAVENOUS
  Filled 2022-08-20: qty 100

## 2022-08-20 MED ORDER — DIPHENHYDRAMINE HCL 25 MG PO CAPS: 50.0000 mg | ORAL_CAPSULE | Freq: Once | ORAL | Status: DC

## 2022-08-20 NOTE — ED Triage Notes (Signed)
Pt presents ambulatory to triage via POV with complaints of swallowing a sunflower seed yesterday afternoon and notes having a " sharp wood sensation" in her throat.  Pt has been able to speak in complete sentences, drink water, and eat a meal without difficulty. Airway patent - respirations equal and unlabored. Pt notes sticking her fingers in her throat to encourage vomiting but has not had any improvement in her sx. A&Ox4 at this time. Denies CP or SOB.

## 2022-08-20 NOTE — ED Provider Notes (Signed)
[facility] Provider Note  Patient Contact: 11:25 PM (approximate)   History   Swallowed Foreign Body   HPI  Haley Hester is a 52 y.o. female who presents to the emergency department complaining of globus sensation after eating some sunflower seeds with a show 1.  Patient states that she likes to eat several hours at a show and accidentally swallowed one of the shelves.  She had sharp sensation in her throat where she would describe as the larynx region.  Patient states that initially she thought it would pass but has had ongoing symptoms.  She notes that she does have GERD as well as she is getting over a cold but has had no difficulty breathing.  Patient states that now every time she eats, drinks, moves or coughs she feels like there is still foreign body in this area.  No stridor.  No other complaints at this time.  Patient denies fever, chills, difficulty breathing, GI symptoms, chest pain, shortness of breath     Physical Exam   Triage Vital Signs: ED Triage Vitals  Enc Vitals Group     BP 08/20/22 2117 (!) 158/92     Pulse Rate 08/20/22 2117 (!) 112     Resp 08/20/22 2117 18     Temp 08/20/22 2117 98.2 F (36.8 C)     Temp Source 08/20/22 2117 Oral     SpO2 08/20/22 2117 100 %     Weight 08/20/22 2115 145 lb 15.1 oz (66.2 kg)     Height 08/20/22 2115 5\' 8"  (1.727 m)     Head Circumference --      Peak Flow --      Pain Score 08/20/22 2115 6     Pain Loc --      Pain Edu? --      Excl. in GC? --     Most recent vital signs: Vitals:   08/20/22 2117  BP: (!) 158/92  Pulse: (!) 112  Resp: 18  Temp: 98.2 F (36.8 C)  SpO2: 100%     General: Alert and in no acute distress. ENT:      Ears:       Nose: No congestion/rhinnorhea.      Mouth/Throat: Mucous membranes are moist.  Visualization of the mouth and oropharynx reveal no signs of trauma, erythema, edema. Neck: No stridor. No cervical spine tenderness to palpation.   Cardiovascular:  Good peripheral perfusion Respiratory: Normal respiratory effort without tachypnea or retractions. Lungs CTAB. Good air entry to the bases with no decreased or absent breath sounds. Musculoskeletal: Full range of motion to all extremities.  Neurologic:  No gross focal neurologic deficits are appreciated.  Skin:   No rash noted Other:   ED Results / Procedures / Treatments   Labs (all labs ordered are listed, but only abnormal results are displayed) Labs Reviewed  COMPREHENSIVE METABOLIC PANEL - Abnormal; Notable for the following components:      Result Value   Sodium 134 (*)    Potassium 2.7 (*)    CO2 20 (*)    Glucose, Bld 112 (*)    BUN <5 (*)    Alkaline Phosphatase 134 (*)    Anion gap 16 (*)    All other components within normal limits  CBC WITH DIFFERENTIAL/PLATELET - Abnormal; Notable for the following components:   WBC 12.8 (*)    Neutro Abs 9.2 (*)    Abs Immature Granulocytes 0.08 (*)    All  other components within normal limits     EKG     RADIOLOGY  I personally viewed, evaluated, and interpreted these images as part of my medical decision making, as well as reviewing the written report by the radiologist.  ED Provider Interpretation: Soft tissue of the neck reveals no evidence of retained foreign body.  Thickening of the glottic tissue is not well-characterized but no apparent epiglottitis.  DG Neck Soft Tissue  Result Date: 08/20/2022 CLINICAL DATA:  Swallowed foreign body EXAM: NECK SOFT TISSUES - 1+ VIEW COMPARISON:  None Available. FINDINGS: There is thickening of the glottic soft tissues best appreciated on lateral examination, not well characterized, however. The epiglottis and aryepiglottic folds are normal. The hypopharynx is not distended. Prevertebral soft tissues are not thickened. Subglottic airway is patent. No retained radiopaque foreign body identified. IMPRESSION: 1. No retained radiopaque foreign body. 2. Thickening of the  glottic soft tissues, not well characterized. This would be better assessed with direct visualization. Electronically Signed   By: Helyn Numbers M.D.   On: 08/20/2022 21:50    PROCEDURES:  Critical Care performed: No  Procedures   MEDICATIONS ORDERED IN ED: Medications  potassium chloride 10 mEq in 100 mL IVPB (has no administration in time range)  sodium chloride 0.9 % bolus 1,000 mL (has no administration in time range)  potassium chloride (KLOR-CON) packet 40 mEq (has no administration in time range)  iohexol (OMNIPAQUE) 300 MG/ML solution 75 mL (75 mLs Intravenous Contrast Given 08/20/22 2343)     IMPRESSION / MDM / ASSESSMENT AND PLAN / ED COURSE  I reviewed the triage vital signs and the nursing notes.                                 Differential diagnosis includes, but is not limited to, retained foreign body, globus sensation, esophagitis, epiglottitis, tracheitis  Patient presents the emergency department with a globus sensation in her throat.  Patient was eating some sunflower seeds when she swallowed one of the shelves.  Patient has since had a globus sensation in this area that is worsened with cough, eating and drinking.  Managing her own secretions, swallowing without difficulty.  She can eat and drink following this episode.  Symptoms have ongoing x 24 hours.  On arrival there was no acute physical exam findings, no stridor or retained visible foreign body or trauma in the oropharynx.  Patient had x-ray which revealed some subtle possible glottic edema.  I reached out to on-call ENT provider, Dr. Willeen Cass who advises that this is likely an over read as the presentation does not match the findings on x-ray as this likely should be infectious process causing these findings not a swallowed foreign body.  However for further characterization ENT recommends CT scan to ensure no evidence of retained foreign body or other signs of true glottic/epiglottic edema.  Patient will have  labs, CT scan performed.  She did have a documented surgical reaction to CT contrast, however patient states that this was injured as a mistake as she developed shingles the day after having a CT not an allergic reaction.  At this time patient will have CT scan, labs for further evaluation.  I will turn the patient over to attending provider, Dr. York Cerise at shift change for final diagnosis and disposition.  As I am leaving patient's labs did reveal hypokalemia and patient will receive IV potassium.  Again final diagnosis and disposition  will be provided by attending provider, Dr. York Cerise.    Note:  This document was prepared using Dragon voice recognition software and may include unintentional dictation errors.   Lanette Hampshire 08/21/22 0005    Loleta Rose, MD 08/21/22 [phone]

## 2022-08-20 NOTE — ED Provider Notes (Signed)
-----------------------------------------   11:51 PM on 08/20/2022 -----------------------------------------  Assuming care from [facility].  In short, Haley Hester is a 52 y.o. female with a chief complaint of globus sensation.  Refer to the original H&P for additional details.  The current plan of care is to follow-up on CT scanning of the neck to evaluate for infection versus foreign body.   Clinical Course as of 08/21/22 0145  Thu Aug 21, 2022  0143  I viewed and interpreted the patient's CT neck.  I see no specific or emergent abnormalities.  The radiologist mentioned possible pharyngeal edema.  I talked with her about this and she is not having any difficulty speaking, swallowing, nor handling her secretions.  She says she has been feeling sick recently.  Given the possibility this could represent an infection, I started her on Augmentin, as well as prescribing prednisone for the inflammation/swelling and Magic mouthwash as previously recommended by Dr. Willeen Cass with ENT.  I recommended that the patient follow-up with her primary care provider and I gave strict return precautions.  She understands and agrees with the plan.  I also gave her prescription for about a weeks worth of supplements for potassium and information about potassium content of foods.  She received potassium supplementation while in the ED.  She is well-appearing and eager to go home rather than staying for the remaining 500 mL of fluid bolus that had previously been ordered.  However given her overall well appearance, I think that the elevated anion gap of 16 is not a clinical concern and the patient is very appropriate for outpatient follow-up.  No indication for hospitalization which I initially considered when I heard the history. [CF]    Clinical Course User Index [CF] Loleta Rose, MD     Medications  predniSONE (DELTASONE) tablet 60 mg (has no administration in time range)  amoxicillin-clavulanate  (AUGMENTIN) 875-125 MG per tablet 1 tablet (has no administration in time range)  potassium chloride 10 mEq in 100 mL IVPB (0 mEq Intravenous Stopped 08/21/22 0118)  iohexol (OMNIPAQUE) 300 MG/ML solution 75 mL (75 mLs Intravenous Contrast Given 08/20/22 2343)  sodium chloride 0.9 % bolus 1,000 mL (1,000 mLs Intravenous New Bag/Given 08/21/22 0019)  potassium chloride (KLOR-CON) packet 40 mEq (40 mEq Oral Given 08/21/22 0015)     ED Discharge Orders          Ordered    amoxicillin-clavulanate (AUGMENTIN) 875-125 MG tablet  2 times daily        08/21/22 0142    predniSONE (DELTASONE) 20 MG tablet  Daily with breakfast        08/21/22 0142    potassium chloride SA (KLOR-CON M20) 20 MEQ tablet  Daily,   Status:  Discontinued        08/21/22 0142    potassium chloride SA (KLOR-CON M20) 20 MEQ tablet  Daily        08/21/22 0142           Final diagnoses:  Pharyngitis, unspecified etiology  Globus sensation  Hypokalemia     Loleta Rose, MD 08/21/22 0145

## 2022-08-21 ENCOUNTER — Telehealth: Payer: Self-pay | Admitting: Family

## 2022-08-21 ENCOUNTER — Other Ambulatory Visit: Payer: Self-pay

## 2022-08-21 MED ORDER — PREDNISONE 20 MG PO TABS
60.0000 mg | ORAL_TABLET | ORAL | Status: AC
  Administered 2022-08-21: 60 mg via ORAL
  Filled 2022-08-21: qty 3

## 2022-08-21 MED ORDER — POTASSIUM CHLORIDE CRYS ER 20 MEQ PO TBCR: 20.0000 meq | EXTENDED_RELEASE_TABLET | Freq: Every day | ORAL | 0 refills | Status: DC

## 2022-08-21 MED ORDER — AMOXICILLIN-POT CLAVULANATE 875-125 MG PO TABS
1.0000 | ORAL_TABLET | Freq: Once | ORAL | Status: AC
  Administered 2022-08-21: 1 via ORAL
  Filled 2022-08-21: qty 1

## 2022-08-21 MED ORDER — MAGIC MOUTHWASH W/LIDOCAINE: 5.0000 mL | Freq: Four times a day (QID) | ORAL | 0 refills | Status: DC | PRN

## 2022-08-21 MED ORDER — PREDNISONE 20 MG PO TABS: 60.0000 mg | ORAL_TABLET | Freq: Every day | ORAL | 0 refills | Status: AC

## 2022-08-21 MED ORDER — AMOXICILLIN-POT CLAVULANATE 875-125 MG PO TABS: 1.0000 | ORAL_TABLET | Freq: Two times a day (BID) | ORAL | 0 refills | Status: AC

## 2022-08-21 NOTE — Telephone Encounter (Signed)
Called and left a detailed message per Dpr, that it looks like the ED prescribed an antibiotic and steroid that would cover an allergic reaction or rash. She was seen yesterday. Advise she should take that rx until completely finished and if no change after 10 days, to schedule an appt to see Tabitha.

## 2022-08-21 NOTE — ED Notes (Signed)
Patient discharged at this time. Ambulated to lobby with independent and steady gait. Breathing unlabored speaking in full sentences. Verbalized understanding of all discharge, follow up, and medication teaching. Discharged homed with all belongings.   

## 2022-08-21 NOTE — Discharge Instructions (Addendum)
Please take the full course of medications as prescribed.  Follow up with your regular primary care provider.  Return to the Emergency Department if you develop new or worsening symptoms that concern you.

## 2022-08-21 NOTE — ED Notes (Signed)
Patient ambulated to and from bathroom with independent and steady gait.  

## 2022-08-21 NOTE — Telephone Encounter (Signed)
Pt called in stated she got a rash on her right side its an allergic reaction . Stated was prescribed  medication for this before but can not remember the name. of RX . Requesting a call back  #[phone]

## 2022-08-22 ENCOUNTER — Other Ambulatory Visit: Payer: Self-pay | Admitting: Family

## 2022-08-22 DIAGNOSIS — I10 Essential (primary) hypertension: Secondary | ICD-10-CM

## 2022-09-07 ENCOUNTER — Other Ambulatory Visit: Payer: Self-pay | Admitting: Family

## 2022-09-07 DIAGNOSIS — I10 Essential (primary) hypertension: Secondary | ICD-10-CM

## 2022-09-07 DIAGNOSIS — R232 Flushing: Secondary | ICD-10-CM

## 2022-09-20 DIAGNOSIS — Z419 Encounter for procedure for purposes other than remedying health state, unspecified: Secondary | ICD-10-CM | POA: Diagnosis not present

## 2022-09-22 ENCOUNTER — Other Ambulatory Visit: Payer: Self-pay

## 2022-09-22 ENCOUNTER — Ambulatory Visit: Payer: Medicaid Other | Admitting: Gastroenterology

## 2022-10-20 DIAGNOSIS — Z419 Encounter for procedure for purposes other than remedying health state, unspecified: Secondary | ICD-10-CM | POA: Diagnosis not present

## 2022-11-07 ENCOUNTER — Other Ambulatory Visit: Payer: Self-pay | Admitting: Family

## 2022-11-07 DIAGNOSIS — I10 Essential (primary) hypertension: Secondary | ICD-10-CM

## 2022-11-07 DIAGNOSIS — K219 Gastro-esophageal reflux disease without esophagitis: Secondary | ICD-10-CM

## 2022-11-18 ENCOUNTER — Telehealth: Payer: Self-pay | Admitting: Family

## 2022-11-18 NOTE — Telephone Encounter (Signed)
Called pharmacy auth needs to be done for Medicaid sending message to auth team to help with that.   Left message for patient to call office to update on information.

## 2022-11-18 NOTE — Telephone Encounter (Signed)
Pt called in stating she has been trying to received her rx for DEXILANT 30 MG capsule DR from pharmacy but they won't release it to her. Pt states she was told it was because of medicaid as to why she cannot receive the rx. Pt is requesting advice on how to get her rx, due to her being out of pills now. Pt also asked is there another type meds she can start so she won't have to go through so much to receive rx? Call back # [phone].

## 2022-11-19 ENCOUNTER — Other Ambulatory Visit (HOSPITAL_COMMUNITY): Payer: Self-pay

## 2022-11-20 ENCOUNTER — Other Ambulatory Visit (HOSPITAL_COMMUNITY): Payer: Self-pay

## 2022-11-20 DIAGNOSIS — Z419 Encounter for procedure for purposes other than remedying health state, unspecified: Secondary | ICD-10-CM | POA: Diagnosis not present

## 2022-11-20 NOTE — Telephone Encounter (Signed)
Called pharmacy, asked for Dexilant to be run through [facility], with DAW 9 override, they are ordering the brand name dexilant for pt and it should be there tomorrow for a copay of $4.00, per [facility]

## 2022-11-20 NOTE — Telephone Encounter (Signed)
Haley Hester notified by telephone that she should be able to pick up the Dexliant at [facility] tomorrow per our PA department with co-pay of $4.

## 2022-12-21 DIAGNOSIS — Z419 Encounter for procedure for purposes other than remedying health state, unspecified: Secondary | ICD-10-CM | POA: Diagnosis not present

## 2022-12-23 ENCOUNTER — Other Ambulatory Visit: Payer: Self-pay | Admitting: Family

## 2022-12-23 DIAGNOSIS — J0110 Acute frontal sinusitis, unspecified: Secondary | ICD-10-CM

## 2022-12-23 DIAGNOSIS — I10 Essential (primary) hypertension: Secondary | ICD-10-CM

## 2022-12-23 DIAGNOSIS — E785 Hyperlipidemia, unspecified: Secondary | ICD-10-CM

## 2023-01-07 ENCOUNTER — Ambulatory Visit: Payer: BLUE CROSS/BLUE SHIELD | Admitting: Family

## 2023-01-15 ENCOUNTER — Ambulatory Visit: Payer: BLUE CROSS/BLUE SHIELD | Admitting: Family

## 2023-01-15 ENCOUNTER — Encounter: Payer: Self-pay | Admitting: Family

## 2023-01-15 VITALS — BP 122/76 | HR 78 | Temp 98.0°F | Ht 68.0 in | Wt 154.0 lb

## 2023-01-15 DIAGNOSIS — E876 Hypokalemia: Secondary | ICD-10-CM | POA: Diagnosis not present

## 2023-01-15 DIAGNOSIS — E538 Deficiency of other specified B group vitamins: Secondary | ICD-10-CM | POA: Diagnosis not present

## 2023-01-15 DIAGNOSIS — E782 Mixed hyperlipidemia: Secondary | ICD-10-CM | POA: Diagnosis not present

## 2023-01-15 DIAGNOSIS — M7989 Other specified soft tissue disorders: Secondary | ICD-10-CM | POA: Insufficient documentation

## 2023-01-15 DIAGNOSIS — R221 Localized swelling, mass and lump, neck: Secondary | ICD-10-CM

## 2023-01-15 DIAGNOSIS — R5383 Other fatigue: Secondary | ICD-10-CM

## 2023-01-15 DIAGNOSIS — E559 Vitamin D deficiency, unspecified: Secondary | ICD-10-CM

## 2023-01-15 DIAGNOSIS — D72829 Elevated white blood cell count, unspecified: Secondary | ICD-10-CM | POA: Diagnosis not present

## 2023-01-15 DIAGNOSIS — R748 Abnormal levels of other serum enzymes: Secondary | ICD-10-CM | POA: Diagnosis not present

## 2023-01-15 LAB — CBC WITH DIFFERENTIAL/PLATELET
Basophils Absolute: 0.1 10*3/uL (ref 0.0–0.1)
Basophils Relative: 1.1 % (ref 0.0–3.0)
Eosinophils Absolute: 0.2 10*3/uL (ref 0.0–0.7)
Eosinophils Relative: 1.9 % (ref 0.0–5.0)
HCT: 43.1 % (ref 36.0–46.0)
Hemoglobin: 14.2 g/dL (ref 12.0–15.0)
Lymphocytes Relative: 31.8 % (ref 12.0–46.0)
Lymphs Abs: 3.1 10*3/uL (ref 0.7–4.0)
MCHC: 32.8 g/dL (ref 30.0–36.0)
MCV: 85.5 fl (ref 78.0–100.0)
Monocytes Absolute: 0.8 10*3/uL (ref 0.1–1.0)
Monocytes Relative: 8.2 % (ref 3.0–12.0)
Neutro Abs: 5.6 10*3/uL (ref 1.4–7.7)
Neutrophils Relative %: 57 % (ref 43.0–77.0)
Platelets: 371 10*3/uL (ref 150.0–400.0)
RBC: 5.04 Mil/uL (ref 3.87–5.11)
RDW: 14.2 % (ref 11.5–15.5)
WBC: 9.8 10*3/uL (ref 4.0–10.5)

## 2023-01-15 LAB — LIPID PANEL
Cholesterol: 222 mg/dL — ABNORMAL HIGH (ref 0–200)
HDL: 45.3 mg/dL (ref >39.00)
LDL Cholesterol: 138 mg/dL — ABNORMAL HIGH (ref 0–99)
NonHDL: 176.54
Total CHOL/HDL Ratio: 5
Triglycerides: 193 mg/dL — ABNORMAL HIGH (ref 0.0–149.0)
VLDL: 38.6 mg/dL (ref 0.0–40.0)

## 2023-01-15 LAB — COMPREHENSIVE METABOLIC PANEL
ALT: 26 U/L (ref 0–35)
AST: 22 U/L (ref 0–37)
Albumin: 4.4 g/dL (ref 3.5–5.2)
Alkaline Phosphatase: 140 U/L — ABNORMAL HIGH (ref 39–117)
BUN: 11 mg/dL (ref 6–23)
CO2: 27 mEq/L (ref 19–32)
Calcium: 10 mg/dL (ref 8.4–10.5)
Chloride: 103 mEq/L (ref 96–112)
Creatinine, Ser: 0.73 mg/dL (ref 0.40–1.20)
GFR: 93.84 mL/min (ref >60.00)
Glucose, Bld: 80 mg/dL (ref 70–99)
Potassium: 4.6 mEq/L (ref 3.5–5.1)
Sodium: 140 mEq/L (ref 135–145)
Total Bilirubin: 0.4 mg/dL (ref 0.2–1.2)
Total Protein: 7.2 g/dL (ref 6.0–8.3)

## 2023-01-15 LAB — VITAMIN D 25 HYDROXY (VIT D DEFICIENCY, FRACTURES): VITD: 17.8 ng/mL — ABNORMAL LOW (ref 30.00–100.00)

## 2023-01-15 LAB — VITAMIN B12: Vitamin B-12: 740 pg/mL (ref 211–911)

## 2023-01-15 LAB — TSH: TSH: 1.4 u[IU]/mL (ref 0.35–5.50)

## 2023-01-15 NOTE — Patient Instructions (Signed)
A referral was placed today for general surgery.  Please let us know if you have not heard back within 2 weeks about the referral.

## 2023-01-19 ENCOUNTER — Other Ambulatory Visit: Payer: Self-pay | Admitting: Family

## 2023-01-19 DIAGNOSIS — E785 Hyperlipidemia, unspecified: Secondary | ICD-10-CM

## 2023-01-19 DIAGNOSIS — I10 Essential (primary) hypertension: Secondary | ICD-10-CM

## 2023-01-19 DIAGNOSIS — E559 Vitamin D deficiency, unspecified: Secondary | ICD-10-CM

## 2023-01-19 MED ORDER — ATORVASTATIN CALCIUM 40 MG PO TABS
40.0000 mg | ORAL_TABLET | Freq: Every day | ORAL | 3 refills | Status: DC
Start: 2023-01-19 — End: 2024-02-23

## 2023-01-19 MED ORDER — CHOLECALCIFEROL 1.25 MG (50000 UT) PO TABS
1.0000 | ORAL_TABLET | ORAL | 0 refills | Status: DC
Start: 2023-01-19 — End: 2024-02-23

## 2023-01-19 NOTE — Assessment & Plan Note (Signed)
Continue atorvastatin 20 mg once daily.  Ordered lipid panel, pending results. Work on low cholesterol diet and exercise as tolerated

## 2023-01-19 NOTE — Assessment & Plan Note (Signed)
unremarkable CT abd pelvis  Repeat Cmp pending results.

## 2023-01-19 NOTE — Assessment & Plan Note (Signed)
Ordered vitamin d pending results.  Continue otc vitamin d supplementation

## 2023-01-19 NOTE — Assessment & Plan Note (Signed)
Becoming more noticeable per pt and also irritating her with movement, so will refer to general surgeon to discuss resection. Reviewed CT soft tissue of neck, unremarkable. Suspected lipoma.

## 2023-01-20 DIAGNOSIS — Z419 Encounter for procedure for purposes other than remedying health state, unspecified: Secondary | ICD-10-CM | POA: Diagnosis not present

## 2023-01-22 ENCOUNTER — Ambulatory Visit: Payer: BLUE CROSS/BLUE SHIELD | Admitting: Surgery

## 2023-01-26 NOTE — H&P (View-Only) (Signed)
Patient ID: Haley Hester, female   DOB: 1971/04/13, 52 y.o.   MRN: [id number]  Chief Complaint: Right neck mass  History of Present Illness Haley Hester is a 52 y.o. female with a mass on right neck for approximately 10 years.  It is beginning to interfere with sleeping on her side.  Denies pain.  Denies drainage, overlying inflammatory skin changes, or other ENT disturbance, illness or chronic process.  Upon further questioning I find that she is actually anticipating dental extraction for bone loss and periodontitis.  Prior imaging did not reveal this lesion.  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 90 tablet 3   Cholecalciferol 1.25 MG (50000 UT) TABS Take 1 tablet by mouth once a week. 8 tablet 0   DEXILANT 30 MG capsule DR TAKE 1 CAPSULE BY MOUTH EVERY DAY 30 capsule 2   EPINEPHrine 0.3 mg/0.3 mL IJ SOAJ injection Inject 0.3 mg into the muscle as needed for anaphylaxis. 1 each 0   montelukast (SINGULAIR) 10 MG tablet TAKE 1 TABLET BY MOUTH EVERYDAY AT BEDTIME 90 tablet 1   PARoxetine (PAXIL) 10 MG tablet TAKE 1  TABLET BY MOUTH EVERY DAY 90 tablet 3   vitamin B-12 (CYANOCOBALAMIN) 100 MCG tablet Take 100 mcg by mouth daily.     No current facility-administered medications for this visit.    Family History Family History  Problem Relation Age of Onset   Hyperlipidemia Mother    Hypertension Mother    Healthy Father        killed on his job   Heart disease Maternal Grandmother    COPD Paternal Aunt       Social History Social History   Tobacco Use   Smoking status: Every Day    Current packs/day: 0.50    Average packs/day: 0.5 packs/day for 40.0 years (20.0 ttl pk-yrs)    Types: Cigarettes    Passive exposure: Past   Smokeless tobacco: Former    Types: Engineer, drilling   Vaping status: Never Used  Substance Use Topics   Alcohol use: Yes    Alcohol/week: 30.0 standard drinks of alcohol    Types: 30 Cans of beer per week    Comment: daily mostly   Drug use: Not Currently        Review of Systems  Constitutional: Negative.   HENT: Negative.    Eyes: Negative.   Respiratory: Negative.    Cardiovascular: Negative.   Gastrointestinal:  Positive for heartburn.  Genitourinary: Negative.  Skin: Negative.   Neurological: Negative.   Psychiatric/Behavioral: Negative.       Physical Exam Blood pressure 130/77, pulse 88, temperature 98 F (36.7 C), height 5\' 8"  (1.727 m), weight 150 lb (68 kg), SpO2 97%. Last Weight  Most recent update: 01/27/2023  8:55 AM    Weight  68 kg (150 lb)             CONSTITUTIONAL: Well developed, and nourished, appropriately responsive and aware without distress.   EYES: Sclera non-icteric.   EARS, NOSE, MOUTH AND THROAT:  The oropharynx is clear. Oral mucosa is pink and moist.     Hearing is intact to voice.  NECK: Trachea is midline, and there is no jugular venous distension.   LYMPH NODES:  Lymph nodes in the neck are not appreciated. There is a 2.5 cm soft tissue mass at the base of the right neck.  This feels superficial, and extra  platysmal, mobile.  However I cannot be certain this is not a superficial lymph node. RESPIRATORY:  Lungs are clear, and breath sounds are equal bilaterally.  Normal respiratory effort without pathologic use of accessory muscles. CARDIOVASCULAR: Heart is regular in rate and rhythm.   Well perfused.  GI: The abdomen is  soft, nontender, and nondistended. There were no palpable masses.  I did not appreciate hepatosplenomegaly.  MUSCULOSKELETAL:  Symmetrical muscle tone appreciated in all four extremities.    SKIN: Skin turgor is normal. No pathologic skin lesions appreciated.  NEUROLOGIC:  Motor and sensation appear grossly normal.  Cranial nerves are grossly without defect. PSYCH:  Alert and oriented to person, place and time. Affect is appropriate for situation.  Data Reviewed I have personally reviewed what is currently available of the patient's imaging, recent labs and medical records.   Labs:     Latest Ref Rng & Units 01/15/2023   12:28 PM 08/20/2022   11:00 PM 07/10/2022   11:17 AM  CBC  WBC 4.0 - 10.5 K/uL 9.8  12.8  10.7   Hemoglobin 12.0 - 15.0 g/dL 08.6  57.8  46.9   Hematocrit 36.0 - 46.0 % 43.1  38.1  42.8   Platelets 150.0 - 400.0 K/uL 371.0  295  363.0       Latest Ref Rng & Units 01/15/2023   12:28 PM 08/20/2022   11:00 PM 07/10/2022   11:17 AM  CMP  Glucose 70 - 99 mg/dL 80  629  92   BUN 6 - 23 mg/dL 11  <5  6   Creatinine 0.40 - 1.20 mg/dL 5.28  4.13  2.44   Sodium 135 - 145 mEq/L 140  134  137   Potassium 3.5 - 5.1 mEq/L 4.6  2.7  3.5   Chloride 96 - 112 mEq/L 103  98  100   CO2 19 - 32 mEq/L 27  20  26    Calcium 8.4 - 10.5 mg/dL 01.0  9.0  27.2   Total Protein 6.0 - 8.3 g/dL 7.2  7.6  8.1   Total Bilirubin 0.2 - 1.2 mg/dL 0.4  0.6  0.5   Alkaline Phos 39 - 117 U/L 140  134  147   AST 0 - 37 U/L 22  25  31    ALT 0 - 35 U/L 26  23  35    Imaging: Radiological images reviewed:  CLINICAL DATA:  Concern for a central are seed stuck in her throat   EXAM: CT NECK  WITH CONTRAST   TECHNIQUE: Multidetector  CT imaging of the neck was performed using the standard protocol following the bolus administration of intravenous contrast.   RADIATION DOSE REDUCTION: This exam was performed according to the departmental dose-optimization program which includes automated exposure control, adjustment of the mA and/or kV according to patient size and/or use of iterative reconstruction technique.   CONTRAST:  75mL OMNIPAQUE IOHEXOL 300 MG/ML  SOLN   COMPARISON:  None Available.   FINDINGS: Pharynx and larynx: Possible mild pharyngeal edema, without focal lesion or collection. No radiopaque foreign body is seen.   Salivary glands: No inflammation, mass, or stone.   Thyroid: Normal.   Lymph nodes: None enlarged or abnormal density.   Vascular: Patent.   Limited intracranial: Negative.   Visualized orbits: Negative.   Mastoids and visualized paranasal sinuses: Clear.   Skeleton: No acute osseous abnormality.   Upper chest: Paraseptal emphysema. Ill-defined centrilobular nodules, as can be seen with smoking related lung disease. No focal pulmonary opacity or pleural effusion.   Other: None.   IMPRESSION: 1. Possible mild pharyngeal edema, without focal lesion or collection. No radiopaque foreign body is seen. 2. Emphysema.   Emphysema (ICD10-J43.9).     Electronically Signed   By: Wiliam Ke M.D.   On: 08/21/2022 01:21 Within last 24 hrs: No results found.  Assessment    Soft tissue mass, neck Patient Active Problem List   Diagnosis Date Noted   Mass of soft tissue of neck 01/15/2023   Hypokalemia 01/15/2023   Vitamin B12 deficiency 01/06/2022   Elevated alkaline phosphatase level 01/02/2022   Non-seasonal allergic rhinitis due to pollen 12/02/2021   Diverticulosis 11/21/2021   Tobacco abuse 11/21/2021   Vitamin D deficiency 11/21/2021   Intractable migraine with aura without status migrainosus 11/21/2021   Lesion of frontal  lobe of brain 11/21/2021   Essential hypertension 05/05/2016   GAD (generalized anxiety disorder) 03/08/2016   Hot flashes 03/08/2016   Gastroesophageal reflux disease 03/08/2016   Hyperlipidemia 03/08/2016   Impingement syndrome of shoulder region 02/21/2016   Osteoarthritis of facet joint of lumbar spine 02/21/2016    Plan    We discussed excision under local anesthesia, versus excision in the OR.  I believe it is prudent to proceed with excision in the OR, with the added cautions that go with it.  Risks include anesthesia, bleeding, infection, swelling or hematoma, seroma, need for additional procedures.  I believe she has had her questions answered, and desires to proceed without any guarantees ever expressed or implied. Face-to-face time spent with the patient and accompanying care providers(if present) was 30 minutes, with more than 50% of the time spent counseling, educating, and coordinating care of the patient.    These notes generated with voice recognition software. I apologize for typographical errors.  Haley Hester M.D., FACS 01/27/2023, 9:47 AM

## 2023-01-26 NOTE — Progress Notes (Unsigned)
Patient ID: Haley Hester, female   DOB: 03/02/1971, 52 y.o.   MRN: [id number]  Chief Complaint: Right neck mass  History of Present Illness Haley Hester is a 52 y.o. female with a mass on right neck for approximately 10 years.  It is beginning to interfere with sleeping on her side.  Denies pain.  Denies drainage, overlying inflammatory skin changes, or other ENT disturbance, illness or chronic process.  Upon further questioning I find that she is actually anticipating dental extraction for bone loss and periodontitis.  Prior imaging did not reveal this lesion.  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 90 tablet 3   Cholecalciferol 1.25 MG (50000 UT) TABS Take 1 tablet by mouth once a week. 8 tablet 0   DEXILANT 30 MG capsule DR TAKE 1 CAPSULE BY MOUTH EVERY DAY 30 capsule 2   EPINEPHrine 0.3 mg/0.3 mL IJ SOAJ injection Inject 0.3 mg into the muscle as needed for anaphylaxis. 1 each 0   montelukast (SINGULAIR) 10 MG tablet TAKE 1 TABLET BY MOUTH EVERYDAY AT BEDTIME 90 tablet 1   PARoxetine (PAXIL) 10 MG tablet TAKE 1  TABLET BY MOUTH EVERY DAY 90 tablet 3   vitamin B-12 (CYANOCOBALAMIN) 100 MCG tablet Take 100 mcg by mouth daily.     No current facility-administered medications for this visit.    Family History Family History  Problem Relation Age of Onset   Hyperlipidemia Mother    Hypertension Mother    Healthy Father        killed on his job   Heart disease Maternal Grandmother    COPD Paternal Aunt       Social History Social History   Tobacco Use   Smoking status: Every Day    Current packs/day: 0.50    Average packs/day: 0.5 packs/day for 40.0 years (20.0 ttl pk-yrs)    Types: Cigarettes    Passive exposure: Past   Smokeless tobacco: Former    Types: Engineer, drilling   Vaping status: Never Used  Substance Use Topics   Alcohol use: Yes    Alcohol/week: 30.0 standard drinks of alcohol    Types: 30 Cans of beer per week    Comment: daily mostly   Drug use: Not Currently        Review of Systems  Constitutional: Negative.   HENT: Negative.    Eyes: Negative.   Respiratory: Negative.    Cardiovascular: Negative.   Gastrointestinal:  Positive for heartburn.  Genitourinary: Negative.  Skin: Negative.   Neurological: Negative.   Psychiatric/Behavioral: Negative.       Physical Exam Blood pressure 130/77, pulse 88, temperature 98 F (36.7 C), height 5\' 8"  (1.727 m), weight 150 lb (68 kg), SpO2 97%. Last Weight  Most recent update: 01/27/2023  8:55 AM    Weight  68 kg (150 lb)             CONSTITUTIONAL: Well developed, and nourished, appropriately responsive and aware without distress.   EYES: Sclera non-icteric.   EARS, NOSE, MOUTH AND THROAT:  The oropharynx is clear. Oral mucosa is pink and moist.     Hearing is intact to voice.  NECK: Trachea is midline, and there is no jugular venous distension.   LYMPH NODES:  Lymph nodes in the neck are not appreciated. There is a 2.5 cm soft tissue mass at the base of the right neck.  This feels superficial, and extra  platysmal, mobile.  However I cannot be certain this is not a superficial lymph node. RESPIRATORY:  Lungs are clear, and breath sounds are equal bilaterally.  Normal respiratory effort without pathologic use of accessory muscles. CARDIOVASCULAR: Heart is regular in rate and rhythm.   Well perfused.  GI: The abdomen is  soft, nontender, and nondistended. There were no palpable masses.  I did not appreciate hepatosplenomegaly.  MUSCULOSKELETAL:  Symmetrical muscle tone appreciated in all four extremities.    SKIN: Skin turgor is normal. No pathologic skin lesions appreciated.  NEUROLOGIC:  Motor and sensation appear grossly normal.  Cranial nerves are grossly without defect. PSYCH:  Alert and oriented to person, place and time. Affect is appropriate for situation.  Data Reviewed I have personally reviewed what is currently available of the patient's imaging, recent labs and medical records.   Labs:     Latest Ref Rng & Units 01/15/2023   12:28 PM 08/20/2022   11:00 PM 07/10/2022   11:17 AM  CBC  WBC 4.0 - 10.5 K/uL 9.8  12.8  10.7   Hemoglobin 12.0 - 15.0 g/dL 16.1  09.6  04.5   Hematocrit 36.0 - 46.0 % 43.1  38.1  42.8   Platelets 150.0 - 400.0 K/uL 371.0  295  363.0       Latest Ref Rng & Units 01/15/2023   12:28 PM 08/20/2022   11:00 PM 07/10/2022   11:17 AM  CMP  Glucose 70 - 99 mg/dL 80  409  92   BUN 6 - 23 mg/dL 11  <5  6   Creatinine 0.40 - 1.20 mg/dL 8.11  9.14  7.82   Sodium 135 - 145 mEq/L 140  134  137   Potassium 3.5 - 5.1 mEq/L 4.6  2.7  3.5   Chloride 96 - 112 mEq/L 103  98  100   CO2 19 - 32 mEq/L 27  20  26    Calcium 8.4 - 10.5 mg/dL 95.6  9.0  21.3   Total Protein 6.0 - 8.3 g/dL 7.2  7.6  8.1   Total Bilirubin 0.2 - 1.2 mg/dL 0.4  0.6  0.5   Alkaline Phos 39 - 117 U/L 140  134  147   AST 0 - 37 U/L 22  25  31    ALT 0 - 35 U/L 26  23  35    Imaging: Radiological images reviewed:  CLINICAL DATA:  Concern for a central are seed stuck in her throat   EXAM: CT NECK  WITH CONTRAST   TECHNIQUE: Multidetector  CT imaging of the neck was performed using the standard protocol following the bolus administration of intravenous contrast.   RADIATION DOSE REDUCTION: This exam was performed according to the departmental dose-optimization program which includes automated exposure control, adjustment of the mA and/or kV according to patient size and/or use of iterative reconstruction technique.   CONTRAST:  75mL OMNIPAQUE IOHEXOL 300 MG/ML  SOLN   COMPARISON:  None Available.   FINDINGS: Pharynx and larynx: Possible mild pharyngeal edema, without focal lesion or collection. No radiopaque foreign body is seen.   Salivary glands: No inflammation, mass, or stone.   Thyroid: Normal.   Lymph nodes: None enlarged or abnormal density.   Vascular: Patent.   Limited intracranial: Negative.   Visualized orbits: Negative.   Mastoids and visualized paranasal sinuses: Clear.   Skeleton: No acute osseous abnormality.   Upper chest: Paraseptal emphysema. Ill-defined centrilobular nodules, as can be seen with smoking related lung disease. No focal pulmonary opacity or pleural effusion.   Other: None.   IMPRESSION: 1. Possible mild pharyngeal edema, without focal lesion or collection. No radiopaque foreign body is seen. 2. Emphysema.   Emphysema (ICD10-J43.9).     Electronically Signed   By: Wiliam Ke M.D.   On: 08/21/2022 01:21 Within last 24 hrs: No results found.  Assessment    Soft tissue mass, neck Patient Active Problem List   Diagnosis Date Noted   Mass of soft tissue of neck 01/15/2023   Hypokalemia 01/15/2023   Vitamin B12 deficiency 01/06/2022   Elevated alkaline phosphatase level 01/02/2022   Non-seasonal allergic rhinitis due to pollen 12/02/2021   Diverticulosis 11/21/2021   Tobacco abuse 11/21/2021   Vitamin D deficiency 11/21/2021   Intractable migraine with aura without status migrainosus 11/21/2021   Lesion of frontal  lobe of brain 11/21/2021   Essential hypertension 05/05/2016   GAD (generalized anxiety disorder) 03/08/2016   Hot flashes 03/08/2016   Gastroesophageal reflux disease 03/08/2016   Hyperlipidemia 03/08/2016   Impingement syndrome of shoulder region 02/21/2016   Osteoarthritis of facet joint of lumbar spine 02/21/2016    Plan    We discussed excision under local anesthesia, versus excision in the OR.  I believe it is prudent to proceed with excision in the OR, with the added cautions that go with it.  Risks include anesthesia, bleeding, infection, swelling or hematoma, seroma, need for additional procedures.  I believe she has had her questions answered, and desires to proceed without any guarantees ever expressed or implied. Face-to-face time spent with the patient and accompanying care providers(if present) was 30 minutes, with more than 50% of the time spent counseling, educating, and coordinating care of the patient.    These notes generated with voice recognition software. I apologize for typographical errors.  Campbell Lerner M.D., FACS 01/27/2023, 9:47 AM

## 2023-01-27 ENCOUNTER — Ambulatory Visit: Payer: Self-pay | Admitting: Surgery

## 2023-01-27 ENCOUNTER — Telehealth: Payer: Self-pay | Admitting: Surgery

## 2023-01-27 ENCOUNTER — Ambulatory Visit (INDEPENDENT_AMBULATORY_CARE_PROVIDER_SITE_OTHER): Payer: BLUE CROSS/BLUE SHIELD | Admitting: Surgery

## 2023-01-27 ENCOUNTER — Encounter: Payer: Self-pay | Admitting: Surgery

## 2023-01-27 VITALS — BP 130/77 | HR 88 | Temp 98.0°F | Ht 68.0 in | Wt 150.0 lb

## 2023-01-27 DIAGNOSIS — M7989 Other specified soft tissue disorders: Secondary | ICD-10-CM

## 2023-01-27 DIAGNOSIS — R221 Localized swelling, mass and lump, neck: Secondary | ICD-10-CM | POA: Diagnosis not present

## 2023-01-27 NOTE — Patient Instructions (Signed)
We have spoken today about removing a lump in your neck This will be done by Dr. Claudine Mouton at [facility].  You will most likely be able to leave the hospital several hours after your surgery.   Please see your Blue surgery sheet for more information. Our surgery scheduler will call you to look at surgery dates and to go over information.   If you have FMLA or Disability paperwork that needs to be filled out, please have your company fax your paperwork to [phone] or you may drop this by either office. This paperwork will be filled out within 3 days after your surgery has been completed.   Lipoma Removal  Lipoma removal is a surgical procedure to remove a lipoma, which is a noncancerous (benign) tumor that is made up of fat cells. Most lipomas are small and painless and do not require treatment. They can form in many areas of the body but are most common under the skin of the back, arms, shoulders, buttocks, and thighs. You may need lipoma removal if you have a lipoma that is large, growing, or causing discomfort. Lipoma removal may also be done for cosmetic reasons. Tell a health care provider about: Any allergies you have. All medicines you are taking, including vitamins, herbs, eye drops, creams, and over-the-counter medicines. Any problems you or family members have had with anesthetic medicines. Any bleeding problems you have. Any surgeries you have had. Any medical conditions you have. Whether you are pregnant or may be pregnant. What are the risks? Generally, this is a safe procedure. However, problems may occur, including: Infection. Bleeding. Scarring. Allergic reactions to medicines. Damage to nearby structures or organs, such as damage to nerves or blood vessels near the lipoma. What happens before the procedure? When to Stop Eating and Drinking Follow instructions from your health care provider about what you may eat and drink before your procedure. These may include: 8  hours before your procedure Stop eating most foods. Do not eat meat, fried foods, or fatty foods. Eat only light foods, such as toast or crackers. All liquids are okay except energy drinks and alcohol. 6 hours before your procedure Stop eating. Drink only clear liquids, such as water, clear fruit juice, black coffee, plain tea, and sports drinks. Do not drink energy drinks or alcohol. 2 hours before your procedure Stop drinking all liquids. You may be allowed to take medicines with small sips of water. If you do not follow your health care provider's instructions, your procedure may be delayed or canceled. Medicines Ask your health care provider about: Changing or stopping your regular medicines. This is especially important if you are taking diabetes medicines or blood thinners. Taking medicines such as aspirin and ibuprofen. These medicines can thin your blood. Do not take these medicines unless your health care provider tells you to take them. Taking over-the-counter medicines, vitamins, herbs, and supplements. General instructions You will have a physical exam. Your health care provider will check the size of the lipoma and whether it can be removed easily. You may have a biopsy and imaging tests, such as X-rays, a CT scan, and an MRI. Do not use any products that contain nicotine or tobacco for at least 4 weeks before the procedure. These products include cigarettes, chewing tobacco, and vaping devices, such as e-cigarettes. If you need help quitting, ask your health care provider. Ask your health care provider: How your surgery site will be marked. What steps will be taken to help prevent infection. These  may include: Washing skin with a germ-killing soap. Taking antibiotic medicine. If you will be going home right after the procedure, plan to have a responsible adult: Take you home from the hospital or clinic. You will not be allowed to drive. Care for you for the time you are  told. What happens during the procedure?  An IV will be inserted into one of your veins. You will be given one or more of the following: A medicine to help you relax (sedative). A medicine to numb the area (local anesthetic). A medicine to make you fall asleep (general anesthetic). A medicine that is injected into an area of your body to numb everything below the injection site (regional anesthetic). An incision will be made into the skin over the lipoma or very near the lipoma. The incision may be made in a natural skin line or crease. Tissues, nerves, and blood vessels near the lipoma will be moved out of the way. The lipoma and the capsule that surrounds it will be separated from the surrounding tissues. The lipoma will be removed. The incision may be closed with stitches (sutures). A bandage (dressing) will be placed over the incision. The procedure may vary among health care providers and hospitals. What happens after the procedure? Your blood pressure, heart rate, breathing rate, and blood oxygen level will be monitored until you leave the hospital or clinic. If you were prescribed an antibiotic medicine, use it as told by your health care provider. Do not stop using the antibiotic even if you start to feel better. If you were given a sedative during the procedure, it can affect you for several hours. Do not drive or operate machinery until your health care provider says that it is safe. Where to find more information OrthoInfo: orthoinfo.aaos.org Summary Before the procedure, follow instructions from your health care provider about eating and drinking, and changing or stopping your regular medicines. This is especially important if you are taking diabetes medicines or blood thinners. After the lipoma is removed, the incision may be closed with stitches (sutures) and covered with a bandage (dressing). If you were given a sedative during the procedure, it can affect you for several  hours. Do not drive or operate machinery until your health care provider says that it is safe. This information is not intended to replace advice given to you by your health care provider. Make sure you discuss any questions you have with your health care provider. Document Revised: 04/26/2021 Document Reviewed: 04/26/2021 Elsevier Patient Education  2024 [employer].

## 2023-01-27 NOTE — Telephone Encounter (Signed)
Patient has been advised of Pre-Admission date/time, and Surgery date at [facility].  Surgery Date: 01/30/23 Preadmission Testing Date: 01/29/23 (phone 8a-1p)  Patient has been made aware to call [phone], between 1-3:00pm the day before surgery, to find out what time to arrive for surgery.

## 2023-01-29 ENCOUNTER — Encounter
Admission: RE | Admit: 2023-01-29 | Discharge: 2023-01-29 | Disposition: A | Discharge status: Home / Self Care | Payer: Medicaid Other | Source: Ambulatory Visit | Attending: Surgery | Admitting: Surgery

## 2023-01-29 VITALS — BP 127/82 | HR 93 | Resp 12

## 2023-01-29 DIAGNOSIS — J439 Emphysema, unspecified: Secondary | ICD-10-CM

## 2023-01-29 DIAGNOSIS — Z01810 Encounter for preprocedural cardiovascular examination: Secondary | ICD-10-CM

## 2023-01-29 DIAGNOSIS — I10 Essential (primary) hypertension: Secondary | ICD-10-CM

## 2023-01-29 HISTORY — DX: Vitamin D deficiency, unspecified: E55.9

## 2023-01-29 HISTORY — DX: Other specified soft tissue disorders: M79.89

## 2023-01-29 HISTORY — DX: Deficiency of other specified B group vitamins: E53.8

## 2023-01-29 HISTORY — DX: Disorder of brain, unspecified: G93.9

## 2023-01-29 HISTORY — DX: Emphysema, unspecified: J43.9

## 2023-01-29 HISTORY — DX: Tobacco use: Z72.0

## 2023-01-29 HISTORY — DX: Anxiety disorder, unspecified: F41.9

## 2023-01-29 HISTORY — DX: Hypokalemia: E87.6

## 2023-01-29 HISTORY — DX: Diverticulosis of intestine, part unspecified, without perforation or abscess without bleeding: K57.90

## 2023-01-29 MED ORDER — CEFAZOLIN SODIUM-DEXTROSE 2-4 GM/100ML-% IV SOLN
2.0000 g | INTRAVENOUS | Status: AC
  Administered 2023-01-30: 2 g via INTRAVENOUS

## 2023-01-29 MED ORDER — BUPIVACAINE LIPOSOME 1.3 % IJ SUSP: 20.0000 mL | Freq: Once | INTRAMUSCULAR | Status: DC

## 2023-01-29 MED ORDER — CELECOXIB 200 MG PO CAPS
200.0000 mg | ORAL_CAPSULE | ORAL | Status: AC
  Administered 2023-01-30: 200 mg via ORAL

## 2023-01-29 MED ORDER — GABAPENTIN 300 MG PO CAPS
300.0000 mg | ORAL_CAPSULE | ORAL | Status: AC
  Administered 2023-01-30: 300 mg via ORAL

## 2023-01-29 MED ORDER — ORAL CARE MOUTH RINSE: 15.0000 mL | Freq: Once | OROMUCOSAL | Status: AC

## 2023-01-29 MED ORDER — CHLORHEXIDINE GLUCONATE 0.12 % MT SOLN
15.0000 mL | Freq: Once | OROMUCOSAL | Status: AC
  Administered 2023-01-30: 15 mL via OROMUCOSAL

## 2023-01-29 MED ORDER — ACETAMINOPHEN 500 MG PO TABS
1000.0000 mg | ORAL_TABLET | ORAL | Status: AC
  Administered 2023-01-30: 1000 mg via ORAL

## 2023-01-29 MED ORDER — CHLORHEXIDINE GLUCONATE CLOTH 2 % EX PADS
6.0000 | MEDICATED_PAD | Freq: Once | CUTANEOUS | Status: AC
  Administered 2023-01-30: 6 via TOPICAL

## 2023-01-29 MED ORDER — LACTATED RINGERS IV SOLN: INTRAVENOUS | Status: DC

## 2023-01-29 NOTE — Patient Instructions (Addendum)
Your procedure is scheduled on:01-30-23 Friday Report to the Registration Desk on the 1st floor of the [facility].Then proceed to the 2nd floor Surgery Desk To find out your arrival time, please call [phone] between 1PM - 3PM on:01-29-23 Thursday If your arrival time is 6:00 am, do not arrive before that time as the [facility] entrance doors do not open until 6:00 am.  REMEMBER: Instructions that are not followed completely may result in serious medical risk, up to and including death; or upon the discretion of your surgeon and anesthesiologist your surgery may need to be rescheduled.  Do not eat food OR drink any liquids after midnight the night before surgery.  No gum chewing or hard candies.  One week prior to surgery: Stop Anti-inflammatories (NSAIDS) such as Advil, Aleve, Ibuprofen, Motrin, Naproxen, Naprosyn and Aspirin based products such as Excedrin, Goody's Powder, BC Powder.  You may however, continue to take Tylenol if needed for pain up until the day of surgery.  Continue taking all of your other prescription medications up until the day of surgery.  ON THE DAY OF SURGERY ONLY TAKE THESE MEDICATIONS WITH SIPS OF WATER: -amLODipine (NORVASC)  -atorvastatin (LIPITOR)  -DEXILANT  -PARoxetine (PAXIL)  -cetirizine (ZYRTEC)   No Alcohol for 24 hours before or after surgery.  No Smoking including e-cigarettes for 24 hours before surgery.  No chewable tobacco products for at least 6 hours before surgery.  No nicotine patches on the day of surgery.  Do not use any "recreational" drugs for at least a week (preferably 2 weeks) before your surgery.  Please be advised that the combination of cocaine and anesthesia may have negative outcomes, up to and including death. If you test positive for cocaine, your surgery will be cancelled.  On the morning of surgery brush your teeth with toothpaste and water, you may rinse your mouth with mouthwash if you wish. Do not swallow  any toothpaste or mouthwash.  Use CHG Soap as directed on instruction sheet.  Do not wear jewelry, make-up, hairpins, clips or nail polish.  For welded (permanent) jewelry: bracelets, anklets, waist bands, etc.  Please have this removed prior to surgery.  If it is not removed, there is a chance that hospital personnel will need to cut it off on the day of surgery.  Do not wear lotions, powders, or perfumes.   Do not shave body hair from the neck down 48 hours before surgery.  Contact lenses, hearing aids and dentures may not be worn into surgery.  Do not bring valuables to the hospital. [facility] is not responsible for any missing/lost belongings or valuables.   Notify your doctor if there is any change in your medical condition (cold, fever, infection).  Wear comfortable clothing (specific to your surgery type) to the hospital.  After surgery, you can help prevent lung complications by doing breathing exercises.  Take deep breaths and cough every 1-2 hours. Your doctor may order a device called an Incentive Spirometer to help you take deep breaths. When coughing or sneezing, hold a pillow firmly against your incision with both hands. This is called "splinting." Doing this helps protect your incision. It also decreases belly discomfort.  If you are being admitted to the hospital overnight, leave your suitcase in the car. After surgery it may be brought to your room.  In case of increased patient census, it may be necessary for you, the patient, to continue your postoperative care in the Same Day Surgery department.  If  you are being discharged the day of surgery, you will not be allowed to drive home. You will need a responsible individual to drive you home and stay with you for 24 hours after surgery.   If you are taking public transportation, you will need to have a responsible individual with you.  Please call the Pre-admissions Testing Dept. at [phone] if you have any  questions about these instructions.  Surgery Visitation Policy:  Patients having surgery or a procedure may have two visitors.  Children under the age of 63 must have an adult with them who is not the patient.     Preparing for Surgery with CHLORHEXIDINE GLUCONATE (CHG) Soap  Chlorhexidine Gluconate (CHG) Soap  o An antiseptic cleaner that kills germs and bonds with the skin to continue killing germs even after washing  o Used for showering the night before surgery and morning of surgery  Before surgery, you can play an important role by reducing the number of germs on your skin.  CHG (Chlorhexidine gluconate) soap is an antiseptic cleanser which kills germs and bonds with the skin to continue killing germs even after washing.  Please do not use if you have an allergy to CHG or antibacterial soaps. If your skin becomes reddened/irritated stop using the CHG.  1. Shower the NIGHT BEFORE SURGERY and the MORNING OF SURGERY with CHG soap.  2. If you choose to wash your hair, wash your hair first as usual with your normal shampoo.  3. After shampooing, rinse your hair and body thoroughly to remove the shampoo.  4. Use CHG as you would any other liquid soap. You can apply CHG directly to the skin and wash gently with a scrungie or a clean washcloth.  5. Apply the CHG soap to your body only from the neck down. Do not use on open wounds or open sores. Avoid contact with your eyes, ears, mouth, and genitals (private parts). Wash face and genitals (private parts) with your normal soap.  6. Wash thoroughly, paying special attention to the area where your surgery will be performed.  7. Thoroughly rinse your body with warm water.  8. Do not shower/wash with your normal soap after using and rinsing off the CHG soap.  9. Pat yourself dry with a clean towel.  10. Wear clean pajamas to bed the night before surgery.  12. Place clean sheets on your bed the night of your first shower and do not  sleep with pets.  13. Shower again with the CHG soap on the day of surgery prior to arriving at the hospital.  14. Do not apply any deodorants/lotions/powders.  15. Please wear clean clothes to the hospital.

## 2023-01-30 ENCOUNTER — Other Ambulatory Visit: Payer: Self-pay

## 2023-01-30 ENCOUNTER — Encounter
Admission: RE | Disposition: A | Discharge status: Home / Self Care | Payer: Self-pay | Source: Home / Self Care | Attending: Surgery

## 2023-01-30 ENCOUNTER — Ambulatory Visit: Payer: BLUE CROSS/BLUE SHIELD | Admitting: Urgent Care

## 2023-01-30 ENCOUNTER — Ambulatory Visit
Admission: RE | Admit: 2023-01-30 | Discharge: 2023-01-30 | Disposition: A | Discharge status: Home / Self Care | Payer: BLUE CROSS/BLUE SHIELD | Attending: Surgery | Admitting: Surgery

## 2023-01-30 ENCOUNTER — Ambulatory Visit: Payer: BLUE CROSS/BLUE SHIELD | Admitting: Anesthesiology

## 2023-01-30 ENCOUNTER — Encounter: Payer: Self-pay | Admitting: Surgery

## 2023-01-30 DIAGNOSIS — E876 Hypokalemia: Secondary | ICD-10-CM | POA: Diagnosis not present

## 2023-01-30 DIAGNOSIS — E559 Vitamin D deficiency, unspecified: Secondary | ICD-10-CM | POA: Diagnosis not present

## 2023-01-30 DIAGNOSIS — F17210 Nicotine dependence, cigarettes, uncomplicated: Secondary | ICD-10-CM | POA: Diagnosis not present

## 2023-01-30 DIAGNOSIS — R12 Heartburn: Secondary | ICD-10-CM | POA: Diagnosis not present

## 2023-01-30 DIAGNOSIS — E538 Deficiency of other specified B group vitamins: Secondary | ICD-10-CM | POA: Diagnosis not present

## 2023-01-30 DIAGNOSIS — E785 Hyperlipidemia, unspecified: Secondary | ICD-10-CM | POA: Insufficient documentation

## 2023-01-30 DIAGNOSIS — J439 Emphysema, unspecified: Secondary | ICD-10-CM | POA: Insufficient documentation

## 2023-01-30 DIAGNOSIS — Z79899 Other long term (current) drug therapy: Secondary | ICD-10-CM | POA: Insufficient documentation

## 2023-01-30 DIAGNOSIS — F419 Anxiety disorder, unspecified: Secondary | ICD-10-CM | POA: Insufficient documentation

## 2023-01-30 DIAGNOSIS — M7989 Other specified soft tissue disorders: Secondary | ICD-10-CM

## 2023-01-30 DIAGNOSIS — K219 Gastro-esophageal reflux disease without esophagitis: Secondary | ICD-10-CM | POA: Diagnosis not present

## 2023-01-30 DIAGNOSIS — I10 Essential (primary) hypertension: Secondary | ICD-10-CM | POA: Insufficient documentation

## 2023-01-30 DIAGNOSIS — G43909 Migraine, unspecified, not intractable, without status migrainosus: Secondary | ICD-10-CM | POA: Insufficient documentation

## 2023-01-30 DIAGNOSIS — M1990 Unspecified osteoarthritis, unspecified site: Secondary | ICD-10-CM | POA: Diagnosis not present

## 2023-01-30 DIAGNOSIS — D170 Benign lipomatous neoplasm of skin and subcutaneous tissue of head, face and neck: Secondary | ICD-10-CM

## 2023-01-30 DIAGNOSIS — D1779 Benign lipomatous neoplasm of other sites: Secondary | ICD-10-CM | POA: Insufficient documentation

## 2023-01-30 HISTORY — PX: EXCISION MASS NECK: SHX6703

## 2023-01-30 SURGERY — EXCISION, MASS, NECK
Anesthesia: General | Laterality: Right

## 2023-01-30 MED ORDER — PROPOFOL 10 MG/ML IV BOLUS
INTRAVENOUS | Status: AC
  Filled 2023-01-30: qty 20

## 2023-01-30 MED ORDER — SUCCINYLCHOLINE CHLORIDE 200 MG/10ML IV SOSY
PREFILLED_SYRINGE | INTRAVENOUS | Status: AC
  Filled 2023-01-30: qty 10

## 2023-01-30 MED ORDER — ONDANSETRON HCL 4 MG/2ML IJ SOLN
INTRAMUSCULAR | Status: AC
  Filled 2023-01-30: qty 2

## 2023-01-30 MED ORDER — FENTANYL CITRATE (PF) 100 MCG/2ML IJ SOLN
INTRAMUSCULAR | Status: AC
  Filled 2023-01-30: qty 2

## 2023-01-30 MED ORDER — CHLORHEXIDINE GLUCONATE 0.12 % MT SOLN
OROMUCOSAL | Status: AC
  Filled 2023-01-30: qty 15

## 2023-01-30 MED ORDER — DEXAMETHASONE SODIUM PHOSPHATE 10 MG/ML IJ SOLN
INTRAMUSCULAR | Status: DC | PRN
  Administered 2023-01-30: 10 mg via INTRAVENOUS

## 2023-01-30 MED ORDER — FENTANYL CITRATE (PF) 100 MCG/2ML IJ SOLN
INTRAMUSCULAR | Status: DC | PRN
  Administered 2023-01-30: 50 ug via INTRAVENOUS

## 2023-01-30 MED ORDER — LIDOCAINE HCL (CARDIAC) PF 100 MG/5ML IV SOSY
PREFILLED_SYRINGE | INTRAVENOUS | Status: DC | PRN
  Administered 2023-01-30: 100 mg via INTRAVENOUS

## 2023-01-30 MED ORDER — LIDOCAINE HCL (PF) 2 % IJ SOLN
INTRAMUSCULAR | Status: AC
  Filled 2023-01-30: qty 5

## 2023-01-30 MED ORDER — BUPIVACAINE-EPINEPHRINE (PF) 0.25% -1:200000 IJ SOLN
INTRAMUSCULAR | Status: AC
  Filled 2023-01-30: qty 30

## 2023-01-30 MED ORDER — OXYCODONE HCL 5 MG/5ML PO SOLN: 5.0000 mg | Freq: Once | ORAL | Status: DC | PRN

## 2023-01-30 MED ORDER — OXYCODONE HCL 5 MG PO TABS: 5.0000 mg | ORAL_TABLET | Freq: Once | ORAL | Status: DC | PRN

## 2023-01-30 MED ORDER — IBUPROFEN 800 MG PO TABS: 800.0000 mg | ORAL_TABLET | Freq: Three times a day (TID) | ORAL | 0 refills | Status: AC | PRN

## 2023-01-30 MED ORDER — FENTANYL CITRATE (PF) 100 MCG/2ML IJ SOLN: 25.0000 ug | INTRAMUSCULAR | Status: DC | PRN

## 2023-01-30 MED ORDER — BUPIVACAINE-EPINEPHRINE (PF) 0.25% -1:200000 IJ SOLN
INTRAMUSCULAR | Status: DC | PRN
  Administered 2023-01-30: 4 mL

## 2023-01-30 MED ORDER — ACETAMINOPHEN 500 MG PO TABS
ORAL_TABLET | ORAL | Status: AC
  Filled 2023-01-30: qty 2

## 2023-01-30 MED ORDER — SUCCINYLCHOLINE CHLORIDE 200 MG/10ML IV SOSY
PREFILLED_SYRINGE | INTRAVENOUS | Status: DC | PRN
  Administered 2023-01-30: 140 mg via INTRAVENOUS

## 2023-01-30 MED ORDER — DEXAMETHASONE SODIUM PHOSPHATE 10 MG/ML IJ SOLN
INTRAMUSCULAR | Status: AC
  Filled 2023-01-30: qty 1

## 2023-01-30 MED ORDER — PROPOFOL 10 MG/ML IV BOLUS
INTRAVENOUS | Status: DC | PRN
  Administered 2023-01-30: 200 mg via INTRAVENOUS

## 2023-01-30 MED ORDER — GABAPENTIN 300 MG PO CAPS
ORAL_CAPSULE | ORAL | Status: AC
  Filled 2023-01-30: qty 1

## 2023-01-30 MED ORDER — CEFAZOLIN SODIUM-DEXTROSE 2-4 GM/100ML-% IV SOLN
INTRAVENOUS | Status: AC
  Filled 2023-01-30: qty 100

## 2023-01-30 MED ORDER — ONDANSETRON HCL 4 MG/2ML IJ SOLN
INTRAMUSCULAR | Status: DC | PRN
  Administered 2023-01-30: 4 mg via INTRAVENOUS

## 2023-01-30 MED ORDER — CELECOXIB 200 MG PO CAPS
ORAL_CAPSULE | ORAL | Status: AC
  Filled 2023-01-30: qty 1

## 2023-01-30 MED ORDER — MIDAZOLAM HCL 2 MG/2ML IJ SOLN
INTRAMUSCULAR | Status: DC | PRN
  Administered 2023-01-30: 2 mg via INTRAVENOUS

## 2023-01-30 MED ORDER — MIDAZOLAM HCL 2 MG/2ML IJ SOLN
INTRAMUSCULAR | Status: AC
  Filled 2023-01-30: qty 2

## 2023-01-30 SURGICAL SUPPLY — 31 items
ADH SKN CLS APL DERMABOND .7 (GAUZE/BANDAGES/DRESSINGS) ×1
APL PRP STRL LF DISP 70% ISPRP (MISCELLANEOUS) ×1
BLADE SURG 15 STRL LF DISP TIS (BLADE) ×1 IMPLANT
BLADE SURG 15 STRL SS (BLADE) ×1
CHLORAPREP W/TINT 26 (MISCELLANEOUS) ×1 IMPLANT
DERMABOND ADVANCED .7 DNX12 (GAUZE/BANDAGES/DRESSINGS) ×1 IMPLANT
DRAPE LAPAROTOMY 77X122 PED (DRAPES) ×1 IMPLANT
ELECT CAUTERY BLADE TIP 2.5 (TIP) ×1
ELECT REM PT RETURN 9FT ADLT (ELECTROSURGICAL) ×1
ELECTRODE CAUTERY BLDE TIP 2.5 (TIP) ×1 IMPLANT
ELECTRODE REM PT RTRN 9FT ADLT (ELECTROSURGICAL) ×1 IMPLANT
GAUZE 4X4 16PLY ~~LOC~~+RFID DBL (SPONGE) ×1 IMPLANT
GLOVE ORTHO TXT STRL SZ7.5 (GLOVE) ×1 IMPLANT
GOWN STRL REUS W/ TWL LRG LVL3 (GOWN DISPOSABLE) ×1 IMPLANT
GOWN STRL REUS W/ TWL XL LVL3 (GOWN DISPOSABLE) ×1 IMPLANT
GOWN STRL REUS W/TWL LRG LVL3 (GOWN DISPOSABLE) ×1
GOWN STRL REUS W/TWL XL LVL3 (GOWN DISPOSABLE) ×1
KIT TURNOVER KIT A (KITS) ×1 IMPLANT
MANIFOLD NEPTUNE II (INSTRUMENTS) ×1 IMPLANT
NDL HYPO 22X1.5 SAFETY MO (MISCELLANEOUS) ×1 IMPLANT
NEEDLE HYPO 22X1.5 SAFETY MO (MISCELLANEOUS) ×1 IMPLANT
PACK BASIN MINOR ARMC (MISCELLANEOUS) ×1 IMPLANT
SUT MNCRL 4-0 (SUTURE) ×1
SUT MNCRL 4-0 27XMFL (SUTURE) ×1
SUT VIC AB 3-0 SH 27 (SUTURE) ×1
SUT VIC AB 3-0 SH 27X BRD (SUTURE) ×1 IMPLANT
SUTURE MNCRL 4-0 27XMF (SUTURE) ×1 IMPLANT
SYR 10ML LL (SYRINGE) ×1 IMPLANT
SYR BULB IRRIG 60ML STRL (SYRINGE) ×1 IMPLANT
TRAP FLUID SMOKE EVACUATOR (MISCELLANEOUS) ×1 IMPLANT
WATER STERILE IRR 500ML POUR (IV SOLUTION) ×1 IMPLANT

## 2023-01-30 NOTE — Anesthesia Preprocedure Evaluation (Signed)
Anesthesia Evaluation  Patient identified by MRN, date of birth, ID band Patient awake    Reviewed: Allergy & Precautions, NPO status , Patient's Chart, lab work & pertinent test results  History of Anesthesia Complications Negative for: history of anesthetic complications  Airway Mallampati: III  TM Distance: <3 FB Neck ROM: full    Dental  (+) Chipped, Poor Dentition   Pulmonary neg shortness of breath, COPD, Current Smoker and Patient abstained from smoking.   Pulmonary exam normal        Cardiovascular Exercise Tolerance: Good hypertension, (-) angina (-) DOE Normal cardiovascular exam     Neuro/Psych  Headaches PSYCHIATRIC DISORDERS         GI/Hepatic Neg liver ROS,GERD  Controlled,,  Endo/Other  negative endocrine ROS    Renal/GU      Musculoskeletal   Abdominal   Peds  Hematology negative hematology ROS (+)   Anesthesia Other Findings Past Medical History: No date: Anxiety No date: Arthritis No date: Diverticulosis No date: Emphysema lung (HCC) No date: GERD (gastroesophageal reflux disease) No date: Hyperlipidemia No date: Hypertension No date: Hypokalemia No date: Lesion of frontal lobe of brain No date: Mass of soft tissue of neck No date: Migraines No date: Tobacco abuse No date: Vitamin B 12 deficiency No date: Vitamin D deficiency  Past Surgical History: No date: ABDOMINAL HYSTERECTOMY     Comment:  partial, still with ovaries and tubes No date: COLONOSCOPY WITH ESOPHAGOGASTRODUODENOSCOPY (EGD) 10/03/2019: COLONOSCOPY WITH PROPOFOL; N/A     Comment:  Procedure: COLONOSCOPY WITH PROPOFOL;  Surgeon: Toney Reil, MD;  Location: [facility] ENDOSCOPY;  Service:               Gastroenterology;  Laterality: N/A; No date: lazy eye surgery R eye     Comment:  as child     Reproductive/Obstetrics negative OB ROS                             Anesthesia  Physical Anesthesia Plan  ASA: 3  Anesthesia Plan: General ETT   Post-op Pain Management:    Induction: Intravenous  PONV Risk Score and Plan: Ondansetron, Dexamethasone, Midazolam and Treatment may vary due to age or medical condition  Airway Management Planned: Oral ETT  Additional Equipment:   Intra-op Plan:   Post-operative Plan: Extubation in OR  Informed Consent: I have reviewed the patients History and Physical, chart, labs and discussed the procedure including the risks, benefits and alternatives for the proposed anesthesia with the patient or authorized representative who has indicated his/her understanding and acceptance.     Dental Advisory Given  Plan Discussed with: Anesthesiologist, CRNA and Surgeon  Anesthesia Plan Comments: (Patient consented for risks of anesthesia including but not limited to:  - adverse reactions to medications - damage to eyes, teeth, lips or other oral mucosa - nerve damage due to positioning  - sore throat or hoarseness - Damage to heart, brain, nerves, lungs, other parts of body or loss of life  Patient voiced understanding and assent.)       Anesthesia Quick Evaluation

## 2023-01-30 NOTE — Op Note (Signed)
Excision subfascial soft tissue neck mass right lower neck, 2.5 cm diameter  Pre-operative Diagnosis: Subplatysmal right neck soft tissue mass 2.5 cm.  Post-operative Diagnosis: same.    Surgeon: Campbell Lerner, M.D., FACS  Anesthesia: General  Findings: Consistent with benign lipoma/mature lipomatous tissue.  Estimated Blood Loss: 3 mL         Specimens: Lipomatous mass approximately 2.5 cm diameter, underlying right inferior platysma.          Complications: none              Procedure Details  The patient was seen again in the Holding Room. The benefits, complications, treatment options, and expected outcomes were discussed with the patient. The risks of bleeding, infection, recurrence of symptoms, failure to resolve symptoms, unanticipated injury, prosthetic placement, prosthetic infection, any of which could require further surgery were reviewed with the patient. The likelihood of improving the patient's symptoms with return to their baseline status is expected.  The patient and/or family concurred with the proposed plan, giving informed consent.  The patient was taken to Operating Room, identified and the procedure verified.    Prior to the induction of general anesthesia, antibiotic prophylaxis was administered. VTE prophylaxis was in place.  General anesthesia was then administered and tolerated well. After the induction, the patient was positioned in the supine position and the right neck, shoulder area was prepped with  Chloraprep and draped in the sterile fashion.  A Time Out was held and the above information confirmed.  Incision was locally infiltrated with quarter percent Marcaine with epinephrine to adequate anesthesia.  Transverse incision is made along the skin lines directly over the mass.  With electrosurgery we carried this incision through the platysma.  The fatty mass of concern was then encountered, circumferentially dissected with blunt dissection using Metzenbaum  scissors.  And removed in its entirety.  Adequate hemostasis assured with electrocautery.  The platysma was reapproximated with 3-0 Vicryl, the skin was reapproximated with interrupted subcuticular of 4-0 Monocryl.  Dermabond was applied to seal the skin.  Patient Toller procedure well was extubated and brought to recovery room in stable condition.      Campbell Lerner M.D., [facility] 01/30/2023 2:15 PM

## 2023-01-30 NOTE — Anesthesia Procedure Notes (Signed)
Procedure Name: Intubation Date/Time: 01/30/2023 1:37 PM  Performed by: Lysbeth Penner, CRNAPre-anesthesia Checklist: Patient identified, Emergency Drugs available, Suction available and Patient being monitored Patient Re-evaluated:Patient Re-evaluated prior to induction Oxygen Delivery Method: Circle system utilized Preoxygenation: Pre-oxygenation with 100% oxygen Induction Type: IV induction Ventilation: Mask ventilation without difficulty Laryngoscope Size: McGraph and 3 Grade View: Grade I Tube type: Oral Tube size: 6.5 mm Number of attempts: 1 Airway Equipment and Method: Stylet and Oral airway Placement Confirmation: ETT inserted through vocal cords under direct vision, positive ETCO2 and breath sounds checked- equal and bilateral Secured at: 20 cm Tube secured with: Tape Dental Injury: Teeth and Oropharynx as per pre-operative assessment

## 2023-01-30 NOTE — Interval H&P Note (Signed)
History and Physical Interval Note:  01/30/2023 1:08 PM  Haley Hester  has presented today for surgery, with the diagnosis of right mass of soft tissue neck.  The various methods of treatment have been discussed with the patient and family. After consideration of risks, benefits and other options for treatment, the patient has consented to  Procedure(s): EXCISION MASS NECK, soft tissue right (Right) as a surgical intervention.  The patient's history has been reviewed, patient examined, no change in status, stable for surgery.  I have reviewed the patient's chart and labs.  Questions were answered to the patient's satisfaction.   The right neck is marked.   Campbell Lerner

## 2023-01-30 NOTE — Transfer of Care (Signed)
Immediate Anesthesia Transfer of Care Note  Patient: Haley Hester  Procedure(s) Performed: EXCISION MASS NECK, soft tissue right (Right)  Patient Location: PACU  Anesthesia Type:General  Level of Consciousness: awake  Airway & Oxygen Therapy: Patient Spontanous Breathing and Patient connected to face mask oxygen  Post-op Assessment: Report given to RN and Post -op Vital signs reviewed and stable  Post vital signs: Reviewed and stable  Last Vitals:  Vitals Value Taken Time  BP 104/75 01/30/23 1415  Temp    Pulse 72 01/30/23 1418  Resp 18 01/30/23 1418  SpO2 98 % 01/30/23 1418  Vitals shown include unfiled device data.  Last Pain:  Vitals:   01/30/23 1129  TempSrc: Temporal  PainSc: 0-No pain         Complications: There were no known notable events for this encounter.

## 2023-01-30 NOTE — Discharge Instructions (Signed)

## 2023-02-02 ENCOUNTER — Encounter: Payer: Self-pay | Admitting: Surgery

## 2023-02-02 LAB — SURGICAL PATHOLOGY

## 2023-02-02 NOTE — Progress Notes (Signed)
noted 

## 2023-02-06 ENCOUNTER — Telehealth: Payer: Self-pay | Admitting: Family

## 2023-02-06 NOTE — Telephone Encounter (Signed)
Patient states she was told to call if her Feet/ankles swell again after surgery, felt swelling start yesterday Really bad today  Please follow-up with the patient at [phone]

## 2023-02-09 NOTE — Anesthesia Postprocedure Evaluation (Signed)
Anesthesia Post Note  Patient: Haley Hester  Procedure(s) Performed: EXCISION MASS NECK, soft tissue right (Right)  Patient location during evaluation: PACU Anesthesia Type: General Level of consciousness: awake and alert Pain management: pain level controlled Vital Signs Assessment: post-procedure vital signs reviewed and stable Respiratory status: spontaneous breathing, nonlabored ventilation, respiratory function stable and patient connected to nasal cannula oxygen Cardiovascular status: blood pressure returned to baseline and stable Postop Assessment: no apparent nausea or vomiting Anesthetic complications: no   There were no known notable events for this encounter.   Last Vitals:  Vitals:   01/30/23 1445 01/30/23 1454  BP: (!) 135/91 (!) 164/81  Pulse: 78 75  Resp: 16 18  Temp:  (!) 36.3 C  SpO2: 100% 100%    Last Pain:  Vitals:   01/30/23 1454  TempSrc: Temporal  PainSc: 0-No pain                 Cleda Mccreedy Lavin Petteway

## 2023-02-09 NOTE — Telephone Encounter (Signed)
Patient called in returning a call she received. Relayed message below to patient and scheduled her. Offered patient earlier appointments but she declined them.

## 2023-02-09 NOTE — Telephone Encounter (Signed)
Please advise pt to make appt. Will need to be assessed. If surgeon told her this then she should also make note to reach out and leave message for office so they are aware.   If pain, sob, chest pain, or swelling/redness needs more urgent assessment either same day or urgent care/ER to r/o blood clot.

## 2023-02-09 NOTE — Telephone Encounter (Signed)
LM for pt to returncall

## 2023-02-10 NOTE — Telephone Encounter (Signed)
Noted  

## 2023-02-12 ENCOUNTER — Encounter: Payer: Self-pay | Admitting: Surgery

## 2023-02-12 ENCOUNTER — Ambulatory Visit (INDEPENDENT_AMBULATORY_CARE_PROVIDER_SITE_OTHER): Payer: BLUE CROSS/BLUE SHIELD | Admitting: Surgery

## 2023-02-12 VITALS — BP 151/82 | HR 91 | Temp 98.2°F | Ht 68.0 in | Wt 154.0 lb

## 2023-02-12 DIAGNOSIS — D170 Benign lipomatous neoplasm of skin and subcutaneous tissue of head, face and neck: Secondary | ICD-10-CM

## 2023-02-12 DIAGNOSIS — M7989 Other specified soft tissue disorders: Secondary | ICD-10-CM

## 2023-02-12 NOTE — Progress Notes (Signed)
[facility] POST-OP OFFICE VISIT  02/12/2023  HPI: Haley Hester is a 52 y.o. female 13 days s/p excision of right lower neck lipomatous lesion.  She reports she can now breathe through her nare that was previously obstructed.  She is quite pleased to be rid of the lump, and denies having any pain or wound issues.  Vital signs: BP (!) 151/82   Pulse 91   Temp 98.2 F (36.8 C)   Ht 5\' 8"  (1.727 m)   Wt 154 lb (69.9 kg)   SpO2 97%   BMI 23.42 kg/m    Physical Exam: Constitutional: She appears well  Skin: Incision appears to be healing nicely.  Assessment/Plan: This is a 52 y.o. female 13 days s/p excision of right base of neck lipoma.  Patient Active Problem List   Diagnosis Date Noted   Mass of soft tissue of neck 01/15/2023   Hypokalemia 01/15/2023   Vitamin B12 deficiency 01/06/2022   Elevated alkaline phosphatase level 01/02/2022   Non-seasonal allergic rhinitis due to pollen 12/02/2021   Diverticulosis 11/21/2021   Tobacco abuse 11/21/2021   Vitamin D deficiency 11/21/2021   Intractable migraine with aura without status migrainosus 11/21/2021   Lesion of frontal lobe of brain 11/21/2021   Essential hypertension 05/05/2016   GAD (generalized anxiety disorder) 03/08/2016   Hot flashes 03/08/2016   Gastroesophageal reflux disease 03/08/2016   Hyperlipidemia 03/08/2016   Impingement syndrome of shoulder region 02/21/2016   Osteoarthritis of facet joint of lumbar spine 02/21/2016    -She is doing well, we will be glad to see her back as needed.   Campbell Lerner M.D., FACS 02/12/2023, 2:01 PM

## 2023-02-12 NOTE — Patient Instructions (Addendum)
Follow-up with our office as needed.  Please call and ask to speak with a nurse if you develop questions or concerns.   May rub Vitamin-E oil or other emmolient agent in area 2-3 times a day to soften. You will need to use sunscreen for the next year on the area to minimize altered pigmentation of the site.  

## 2023-02-13 ENCOUNTER — Other Ambulatory Visit: Payer: Self-pay | Admitting: Family

## 2023-02-13 DIAGNOSIS — K219 Gastro-esophageal reflux disease without esophagitis: Secondary | ICD-10-CM

## 2023-02-13 DIAGNOSIS — I10 Essential (primary) hypertension: Secondary | ICD-10-CM

## 2023-02-20 ENCOUNTER — Ambulatory Visit: Payer: BLUE CROSS/BLUE SHIELD | Admitting: Family

## 2023-02-20 DIAGNOSIS — Z419 Encounter for procedure for purposes other than remedying health state, unspecified: Secondary | ICD-10-CM | POA: Diagnosis not present

## 2023-02-23 ENCOUNTER — Encounter: Payer: Self-pay | Admitting: Family

## 2023-03-22 DIAGNOSIS — Z419 Encounter for procedure for purposes other than remedying health state, unspecified: Secondary | ICD-10-CM | POA: Diagnosis not present

## 2023-03-27 ENCOUNTER — Other Ambulatory Visit: Payer: Self-pay | Admitting: Family

## 2023-03-27 DIAGNOSIS — E559 Vitamin D deficiency, unspecified: Secondary | ICD-10-CM

## 2023-04-22 DIAGNOSIS — Z419 Encounter for procedure for purposes other than remedying health state, unspecified: Secondary | ICD-10-CM | POA: Diagnosis not present

## 2023-05-23 DIAGNOSIS — Z419 Encounter for procedure for purposes other than remedying health state, unspecified: Secondary | ICD-10-CM | POA: Diagnosis not present

## 2023-06-20 DIAGNOSIS — Z419 Encounter for procedure for purposes other than remedying health state, unspecified: Secondary | ICD-10-CM | POA: Diagnosis not present

## 2023-08-01 DIAGNOSIS — Z419 Encounter for procedure for purposes other than remedying health state, unspecified: Secondary | ICD-10-CM | POA: Diagnosis not present

## 2023-08-17 ENCOUNTER — Other Ambulatory Visit: Payer: Self-pay | Admitting: Family

## 2023-08-17 DIAGNOSIS — I10 Essential (primary) hypertension: Secondary | ICD-10-CM

## 2023-08-17 DIAGNOSIS — J0110 Acute frontal sinusitis, unspecified: Secondary | ICD-10-CM

## 2023-08-18 ENCOUNTER — Other Ambulatory Visit (HOSPITAL_COMMUNITY): Payer: Self-pay

## 2023-08-18 ENCOUNTER — Telehealth: Payer: Self-pay

## 2023-08-18 NOTE — Telephone Encounter (Signed)
 Pharmacy Patient Advocate Encounter   Received notification from CoverMyMeds that prior authorization for Dexlansoprazole  30MG  dr capsules is required/requested.   Insurance verification completed.   The patient is insured through [facility] .   Per test claim: PA required; PA submitted to above mentioned insurance via CoverMyMeds Key/confirmation #/EOC (Key: [id number])  Status is pending

## 2023-08-19 ENCOUNTER — Other Ambulatory Visit (HOSPITAL_COMMUNITY): Payer: Self-pay

## 2023-08-19 NOTE — Telephone Encounter (Signed)
 Pharmacy Patient Advocate Encounter  Received notification from [facility] Medicaid that Prior Authorization for Dexilant  30 mg capsules has been APPROVED from 08/18/23 to 04/20/24. Ran test claim, Copay is $4. This test claim was processed through [facility]- copay amounts may vary at other pharmacies due to pharmacy/plan contracts, or as the patient moves through the different stages of their insurance plan.   PA #/Case ID/Reference #: [id number]

## 2023-08-31 DIAGNOSIS — Z419 Encounter for procedure for purposes other than remedying health state, unspecified: Secondary | ICD-10-CM | POA: Diagnosis not present

## 2023-09-22 DIAGNOSIS — R200 Anesthesia of skin: Secondary | ICD-10-CM | POA: Diagnosis not present

## 2023-09-22 DIAGNOSIS — Z1331 Encounter for screening for depression: Secondary | ICD-10-CM | POA: Diagnosis not present

## 2023-09-22 DIAGNOSIS — M25474 Effusion, right foot: Secondary | ICD-10-CM | POA: Diagnosis not present

## 2023-09-22 DIAGNOSIS — M25471 Effusion, right ankle: Secondary | ICD-10-CM | POA: Diagnosis not present

## 2023-09-22 DIAGNOSIS — R202 Paresthesia of skin: Secondary | ICD-10-CM | POA: Diagnosis not present

## 2023-09-22 DIAGNOSIS — M25472 Effusion, left ankle: Secondary | ICD-10-CM | POA: Diagnosis not present

## 2023-09-22 DIAGNOSIS — M25475 Effusion, left foot: Secondary | ICD-10-CM | POA: Diagnosis not present

## 2023-10-01 DIAGNOSIS — G8929 Other chronic pain: Secondary | ICD-10-CM | POA: Diagnosis not present

## 2023-10-01 DIAGNOSIS — M25522 Pain in left elbow: Secondary | ICD-10-CM | POA: Diagnosis not present

## 2023-10-01 DIAGNOSIS — Z419 Encounter for procedure for purposes other than remedying health state, unspecified: Secondary | ICD-10-CM | POA: Diagnosis not present

## 2023-10-01 DIAGNOSIS — M7711 Lateral epicondylitis, right elbow: Secondary | ICD-10-CM | POA: Diagnosis not present

## 2023-10-31 DIAGNOSIS — Z419 Encounter for procedure for purposes other than remedying health state, unspecified: Secondary | ICD-10-CM | POA: Diagnosis not present

## 2023-11-04 DIAGNOSIS — M25472 Effusion, left ankle: Secondary | ICD-10-CM | POA: Diagnosis not present

## 2023-11-04 DIAGNOSIS — R42 Dizziness and giddiness: Secondary | ICD-10-CM | POA: Diagnosis not present

## 2023-11-04 DIAGNOSIS — R200 Anesthesia of skin: Secondary | ICD-10-CM | POA: Diagnosis not present

## 2023-11-04 DIAGNOSIS — R202 Paresthesia of skin: Secondary | ICD-10-CM | POA: Diagnosis not present

## 2023-11-04 DIAGNOSIS — M25475 Effusion, left foot: Secondary | ICD-10-CM | POA: Diagnosis not present

## 2023-11-04 DIAGNOSIS — M25471 Effusion, right ankle: Secondary | ICD-10-CM | POA: Diagnosis not present

## 2023-11-04 DIAGNOSIS — M25474 Effusion, right foot: Secondary | ICD-10-CM | POA: Diagnosis not present

## 2023-11-12 DIAGNOSIS — M7711 Lateral epicondylitis, right elbow: Secondary | ICD-10-CM | POA: Diagnosis not present

## 2023-11-12 DIAGNOSIS — G5601 Carpal tunnel syndrome, right upper limb: Secondary | ICD-10-CM | POA: Diagnosis not present

## 2023-11-19 DIAGNOSIS — M7711 Lateral epicondylitis, right elbow: Secondary | ICD-10-CM | POA: Diagnosis not present

## 2023-11-19 DIAGNOSIS — G5603 Carpal tunnel syndrome, bilateral upper limbs: Secondary | ICD-10-CM | POA: Diagnosis not present

## 2023-11-26 ENCOUNTER — Other Ambulatory Visit: Payer: Self-pay | Admitting: Family

## 2023-11-26 DIAGNOSIS — I10 Essential (primary) hypertension: Secondary | ICD-10-CM

## 2023-12-01 DIAGNOSIS — Z419 Encounter for procedure for purposes other than remedying health state, unspecified: Secondary | ICD-10-CM | POA: Diagnosis not present

## 2023-12-10 ENCOUNTER — Other Ambulatory Visit: Payer: Self-pay | Admitting: Family

## 2023-12-10 DIAGNOSIS — R232 Flushing: Secondary | ICD-10-CM

## 2024-01-01 DIAGNOSIS — Z419 Encounter for procedure for purposes other than remedying health state, unspecified: Secondary | ICD-10-CM | POA: Diagnosis not present

## 2024-01-12 DIAGNOSIS — R200 Anesthesia of skin: Secondary | ICD-10-CM | POA: Diagnosis not present

## 2024-01-13 DIAGNOSIS — R200 Anesthesia of skin: Secondary | ICD-10-CM | POA: Diagnosis not present

## 2024-01-16 ENCOUNTER — Other Ambulatory Visit: Payer: Self-pay | Admitting: Family

## 2024-01-16 DIAGNOSIS — K219 Gastro-esophageal reflux disease without esophagitis: Secondary | ICD-10-CM

## 2024-02-17 ENCOUNTER — Ambulatory Visit: Payer: Self-pay

## 2024-02-17 NOTE — Telephone Encounter (Signed)
 Copied from CRM [id number]. Topic: Clinical - Red Word Triage FYI Only or Action Required?: FYI only for provider: appointment scheduled on 02/23/2024.  Patient was last seen in primary care on 01/15/2023 by Corwin Antu, FNP.  Called Nurse Triage reporting Abdominal Pain.  Symptoms began several years ago.  Interventions attempted: Nothing.  Symptoms are: gradually worsening.  Triage Disposition: See PCP Within 2 Weeks  Patient/caregiver understands and will follow disposition?: Yes       >> Feb 17, 2024  9:39 AM Harlene ORN wrote: Red Word that prompted transfer to Nurse Triage: stomach pain in her left side; burning sensation. Constipation even with the bowel movements. Hurts to sleep on her left side. Reason for Disposition  Abdominal pain is a chronic symptom (recurrent or ongoing AND present > 4 weeks)  Answer Assessment - Initial Assessment Questions 1. LOCATION: Where does it hurt?      L side of abdominal area  2. RADIATION: Does the pain shoot anywhere else? (e.g., chest, back)     Radiates to navel area from her side of her hip  3. ONSET: When did the pain begin? (e.g., minutes, hours or days ago)      3 years ago  4. PATTERN Does the pain come and go, or is it constant?     Burning sensation comes and goes  5. SEVERITY: How bad is the pain?  (e.g., Scale 1-10; mild, moderate, or severe)     Moderate 6. RECURRENT SYMPTOM: Have you ever had this type of stomach pain before? If Yes, ask: When was the last time? and What happened that time?      Yes  7. CAUSE: What do you think is causing the stomach pain? (e.g., gallstones, recent abdominal surgery)     She was told it could be a possible hernia  8. RELIEVING/AGGRAVATING FACTORS: What makes it better or worse? (e.g., antacids, bending or twisting motion, bowel movement)     Lying down makes symptoms worse  9. OTHER SYMPTOMS: Do you have any other symptoms? (e.g., back pain, diarrhea, fever,  urination pain, vomiting)       Constipation  She states she always feels like her stomach is about to burst and is hard in nature. She states she always has an urge to defecate but is unable to empty her bowels.  Pt states she has an appointment in April for GI referral.  Protocols used: Abdominal Pain - [facility]

## 2024-02-22 ENCOUNTER — Other Ambulatory Visit: Payer: Self-pay | Admitting: Family

## 2024-02-22 DIAGNOSIS — I10 Essential (primary) hypertension: Secondary | ICD-10-CM

## 2024-02-23 ENCOUNTER — Ambulatory Visit (INDEPENDENT_AMBULATORY_CARE_PROVIDER_SITE_OTHER): Admitting: Family

## 2024-02-23 VITALS — BP 136/84 | HR 83 | Temp 98.6°F | Ht 68.0 in | Wt 160.0 lb

## 2024-02-23 DIAGNOSIS — K5229 Other allergic and dietetic gastroenteritis and colitis: Secondary | ICD-10-CM

## 2024-02-23 DIAGNOSIS — E559 Vitamin D deficiency, unspecified: Secondary | ICD-10-CM | POA: Diagnosis not present

## 2024-02-23 DIAGNOSIS — R739 Hyperglycemia, unspecified: Secondary | ICD-10-CM | POA: Diagnosis not present

## 2024-02-23 DIAGNOSIS — R748 Abnormal levels of other serum enzymes: Secondary | ICD-10-CM

## 2024-02-23 DIAGNOSIS — R1011 Right upper quadrant pain: Secondary | ICD-10-CM | POA: Diagnosis not present

## 2024-02-23 DIAGNOSIS — R635 Abnormal weight gain: Secondary | ICD-10-CM

## 2024-02-23 DIAGNOSIS — R232 Flushing: Secondary | ICD-10-CM

## 2024-02-23 DIAGNOSIS — E876 Hypokalemia: Secondary | ICD-10-CM

## 2024-02-23 DIAGNOSIS — K219 Gastro-esophageal reflux disease without esophagitis: Secondary | ICD-10-CM | POA: Diagnosis not present

## 2024-02-23 DIAGNOSIS — E782 Mixed hyperlipidemia: Secondary | ICD-10-CM

## 2024-02-23 DIAGNOSIS — D72829 Elevated white blood cell count, unspecified: Secondary | ICD-10-CM | POA: Insufficient documentation

## 2024-02-23 DIAGNOSIS — E785 Hyperlipidemia, unspecified: Secondary | ICD-10-CM

## 2024-02-23 DIAGNOSIS — J0110 Acute frontal sinusitis, unspecified: Secondary | ICD-10-CM

## 2024-02-23 DIAGNOSIS — E538 Deficiency of other specified B group vitamins: Secondary | ICD-10-CM | POA: Diagnosis not present

## 2024-02-23 DIAGNOSIS — I10 Essential (primary) hypertension: Secondary | ICD-10-CM

## 2024-02-23 MED ORDER — MELOXICAM 7.5 MG PO TABS: 7.5000 mg | ORAL_TABLET | Freq: Every day | ORAL | 3 refills | Status: AC

## 2024-02-23 MED ORDER — AMLODIPINE BESYLATE 10 MG PO TABS: 10.0000 mg | ORAL_TABLET | Freq: Every day | ORAL | 3 refills | Status: AC

## 2024-02-23 MED ORDER — ATORVASTATIN CALCIUM 40 MG PO TABS: 40.0000 mg | ORAL_TABLET | Freq: Every day | ORAL | 3 refills | Status: AC

## 2024-02-23 MED ORDER — VEOZAH 45 MG PO TABS: 45.0000 mg | ORAL_TABLET | Freq: Every day | ORAL | 0 refills | Status: AC

## 2024-02-23 MED ORDER — MONTELUKAST SODIUM 10 MG PO TABS: 10.0000 mg | ORAL_TABLET | Freq: Every day | ORAL | 3 refills | Status: AC

## 2024-02-23 MED ORDER — DEXLANSOPRAZOLE 30 MG PO CPDR: 30.0000 mg | DELAYED_RELEASE_CAPSULE | Freq: Every day | ORAL | 3 refills | Status: AC

## 2024-02-23 NOTE — Patient Instructions (Addendum)
  I have sent in your order electronically for the following: ultrasound abdomen  at this location below. Please call to schedule the appointment at your convenience  [facility] outpatient imaging center off [address], [place] [address] Phone [phone]-  8-5 pm    ------------------------------------ Add fiber supplement once daily.  Add a probiotic (such as Florastor) daily. Drink 64 oz of water a day. Eat lots of fresh fruit and veggies. Ensure regular exercise.    If you are not able to have regular BM's with the above regimen, you may add miralax 1 tablespoon daily.  Increase or decrease amount/frequency as needed to ensure 1 soft BM/day.   ------------------------------------

## 2024-02-23 NOTE — Progress Notes (Signed)
 Established Patient Office Visit  Subjective:      CC:  Chief Complaint  Patient presents with   Abdominal Pain    Needs refill on Dexilant     HPI: Haley Hester is a 53 y.o. female presenting on 02/23/2024 for Abdominal Pain (Needs refill on Dexilant ) .  Discussed the use of AI scribe software for clinical note transcription with the patient, who gave verbal consent to proceed.  History of Present Illness Haley Hester is a 53 year old female who presents with abdominal bloating and discomfort.  She experiences abdominal bloating and discomfort, describing a sensation of 'somebody's pulling inside my belly button.' There is a burning sensation near her belly button and lower abdomen, which is intermittent. No bowel changes are noted, but she often feels bloated. Her appetite is poor, and she consumes very little food, often just a can of [place] sausages or half a sandwich a day. She reports some weight gain and acknowledges her diet includes high-sodium and high-cholesterol foods.  She has a history of a hysterectomy but retains her ovaries and gallbladder. No blood in stool is reported, but bowel movements are infrequent and sometimes consist of 'little rabbit turds.' She does not use any laxatives or stool softeners regularly.  She mentions a history of heartburn, which has been flaring up, especially since she ran out of Dexilant . The heartburn is described as a burning sensation that feels like 'somebody's in there pulling and jabbing.'  Her family history includes high cholesterol and a sister recently diagnosed with prediabetes. She is no longer working due to elbow issues and spends her time being active around the house and with her mother.  Current medications include pregabalin 100 mg twice a day for nerve pain. She was previously on Paxil  (paroxetine ) for hot flashes, which she reports was not helpful.       Lab Results  Component Value Date   TSH 1.40  01/15/2023   Wt Readings from Last 3 Encounters:  02/23/24 160 lb (72.6 kg)  02/12/23 154 lb (69.9 kg)  01/27/23 150 lb (68 kg)           Social history:  Relevant past medical, surgical, family and social history reviewed and updated as indicated. Interim medical history since our last visit reviewed.  Allergies and medications reviewed and updated.  DATA REVIEWED: CHART IN EPIC     ROS: Negative unless specifically indicated above in HPI.    Current Outpatient Medications:    EPINEPHrine  0.3 mg/0.3 mL IJ SOAJ injection, Inject 0.3 mg into the muscle as needed for anaphylaxis., Disp: 1 each, Rfl: 0   ibuprofen  (ADVIL ) 800 MG tablet, Take 1 tablet (800 mg total) by mouth every 8 (eight) hours as needed., Disp: 30 tablet, Rfl: 0   MELATONIN PO, Take 1 tablet by mouth as needed., Disp: , Rfl:    pregabalin (LYRICA) 100 MG capsule, Take 100 mg by mouth 2 (two) times daily., Disp: , Rfl:    amLODipine  (NORVASC ) 10 MG tablet, Take 1 tablet (10 mg total) by mouth daily. MUST HAVE OV FOR FURTHER REFILLS, Disp: 90 tablet, Rfl: 3   atorvastatin  (LIPITOR) 40 MG tablet, Take 1 tablet (40 mg total) by mouth daily., Disp: 90 tablet, Rfl: 3   Dexlansoprazole  (DEXILANT ) 30 MG capsule DR, Take 1 capsule (30 mg total) by mouth daily., Disp: 90 capsule, Rfl: 3   Fezolinetant (VEOZAH) 45 MG TABS, Take 1 tablet (45 mg total) by mouth daily., Disp: 90  tablet, Rfl: 0   meloxicam  (MOBIC ) 7.5 MG tablet, Take 1 tablet (7.5 mg total) by mouth daily., Disp: 90 tablet, Rfl: 3   montelukast  (SINGULAIR ) 10 MG tablet, Take 1 tablet (10 mg total) by mouth at bedtime., Disp: 90 tablet, Rfl: 3        Objective:        BP 136/84   Pulse 83   Temp 98.6 F (37 C) (Temporal)   Ht 5' 8 (1.727 m)   Wt 160 lb (72.6 kg)   SpO2 95%   BMI 24.33 kg/m   Physical Exam ABDOMEN: Tenderness on palpation, with increased tenderness on deep breath. Mild tenderness under wrist and localized tenderness in  abdomen.  Wt Readings from Last 3 Encounters:  02/23/24 160 lb (72.6 kg)  02/12/23 154 lb (69.9 kg)  01/27/23 150 lb (68 kg)    Physical Exam Vitals reviewed.  Constitutional:      General: She is not in acute distress.    Appearance: Normal appearance. She is obese. She is not ill-appearing, toxic-appearing or diaphoretic.  HENT:     Head: Normocephalic.  Cardiovascular:     Rate and Rhythm: Normal rate.  Pulmonary:     Effort: Pulmonary effort is normal.  Abdominal:     Tenderness: There is abdominal tenderness in the right upper quadrant and left lower quadrant. There is no guarding or rebound. Positive signs include Murphy's sign (very mild).  Musculoskeletal:        General: Normal range of motion.  Neurological:     General: No focal deficit present.     Mental Status: She is alert and oriented to person, place, and time. Mental status is at baseline.  Psychiatric:        Mood and Affect: Mood normal.        Behavior: Behavior normal.        Thought Content: Thought content normal.        Judgment: Judgment normal.          Results RADIOLOGY CT scan abdomen: No splenomegaly, slight hyperenhancement of the lower pole of the kidney, no evidence of active bowel disease, vascular aortic atherosclerosis (07/09/2022) Pelvic ultrasound: Normal appearance of right ovary, left ovary not visualized (2024)  Assessment & Plan:   Assessment and Plan Assessment & Plan Abdominal Pain and Bloating Chronic abdominal pain and bloating with a burning sensation near the umbilicus, likely related to dietary habits and constipation. Previous imaging showed no active bowel disease. Poor dietary choices high in sodium and cholesterol contribute to symptoms. Constipation exacerbates abdominal discomfort, with infrequent bowel movements and a sensation of incomplete evacuation. - Recommend dietary modifications to reduce sodium and cholesterol intake and increase fiber and protein  intake. - Encourage increased water intake to 6-8 glasses per day. - Start Miralax daily to improve bowel movements. - Consider adding a fiber supplement and a probiotic like FloraStor. - Schedule an abdominal ultrasound in [place] to further evaluate ruq abd pain   Constipation Chronic constipation likely due to poor dietary habits and low fiber intake, contributing to bloating and abdominal discomfort. - Start Miralax daily to improve bowel movements. - Consider adding a fiber supplement and a probiotic like FloraStor. - Encourage increased water intake to 6-8 glasses per day.  Gastroesophageal Reflux Disease (GERD) Intermittent heartburn likely exacerbated by dietary habits. Out of Dexilant , which has been effective in managing symptoms. Dietary modifications may help manage GERD symptoms. - Refill Dexilant  prescription. -Try to decrease and or  avoid spicy foods, fried fatty foods, and also caffeine and chocolate as these can increase heartburn symptoms.   Hyperlipidemia Elevated cholesterol and aortic atherosclerosis, likely exacerbated by dietary habits high in cholesterol and sodium. Family history of prediabetes noted. Dietary changes are crucial to manage cholesterol levels and prevent further atherosclerosis. - Refill atorvastatin  prescription. - Recommend dietary modifications to reduce cholesterol and sodium intake.  Hot Flashes Severe hot flashes not relieved by paroxetine . Over-the-counter remedies have been ineffective. Paroxetine  may contribute to weight gain. Alternative non-hormonal treatments are being considered. - Discontinue paroxetine  due to lack of efficacy and potential contribution to weight gain. - Attempt to get Veozah, a non-hormonal treatment for hot flashes, covered by insurance.  General Health Maintenance Up to date on colonoscopy, with the next one due in 2028. Retains ovaries and bladder post-hysterectomy. Routine health maintenance and screenings are  current. - Continue routine health maintenance and screenings as scheduled.  Follow-up Requires follow-up to assess the effectiveness of dietary changes and treatment adjustments. Labs are needed to evaluate liver and kidney function, and thyroid levels. - Schedule follow-up appointment in one month to reassess symptoms and treatment efficacy. - Order labs to check liver and kidney function, and thyroid levels.        Return in about 1 month (around 03/24/2024) for abdominal bloating.     Ginger Patrick, MSN, APRN, FNP-C [facility]

## 2024-02-24 ENCOUNTER — Ambulatory Visit: Payer: Self-pay | Admitting: Family

## 2024-02-24 DIAGNOSIS — R748 Abnormal levels of other serum enzymes: Secondary | ICD-10-CM

## 2024-02-24 LAB — COMPREHENSIVE METABOLIC PANEL WITH GFR
ALT: 25 U/L (ref 0–35)
AST: 29 U/L (ref 0–37)
Albumin: 4.6 g/dL (ref 3.5–5.2)
Alkaline Phosphatase: 130 U/L — ABNORMAL HIGH (ref 39–117)
BUN: 10 mg/dL (ref 6–23)
CO2: 28 meq/L (ref 19–32)
Calcium: 9.8 mg/dL (ref 8.4–10.5)
Chloride: 101 meq/L (ref 96–112)
Creatinine, Ser: 0.87 mg/dL (ref 0.40–1.20)
GFR: 75.43 mL/min (ref >60.00)
Glucose, Bld: 74 mg/dL (ref 70–99)
Potassium: 4.1 meq/L (ref 3.5–5.1)
Sodium: 140 meq/L (ref 135–145)
Total Bilirubin: 0.5 mg/dL (ref 0.2–1.2)
Total Protein: 7.2 g/dL (ref 6.0–8.3)

## 2024-02-24 LAB — CBC
HCT: 42 % (ref 36.0–46.0)
Hemoglobin: 14 g/dL (ref 12.0–15.0)
MCHC: 33.4 g/dL (ref 30.0–36.0)
MCV: 87.3 fl (ref 78.0–100.0)
Platelets: 285 K/uL (ref 150.0–400.0)
RBC: 4.81 Mil/uL (ref 3.87–5.11)
RDW: 14 % (ref 11.5–15.5)
WBC: 8.5 K/uL (ref 4.0–10.5)

## 2024-02-24 LAB — LIPID PANEL
Cholesterol: 203 mg/dL — ABNORMAL HIGH (ref 0–200)
HDL: 41.4 mg/dL (ref >39.00)
LDL Cholesterol: 110 mg/dL — ABNORMAL HIGH (ref 0–99)
NonHDL: 161.81
Total CHOL/HDL Ratio: 5
Triglycerides: 261 mg/dL — ABNORMAL HIGH (ref 0.0–149.0)
VLDL: 52.2 mg/dL — ABNORMAL HIGH (ref 0.0–40.0)

## 2024-02-24 LAB — TSH: TSH: 1.25 u[IU]/mL (ref 0.35–5.50)

## 2024-02-24 LAB — HEMOGLOBIN A1C: Hgb A1c MFr Bld: 5.9 % (ref 4.6–6.5)

## 2024-02-25 LAB — VITAMIN D 25 HYDROXY (VIT D DEFICIENCY, FRACTURES): VITD: 16.89 ng/mL — ABNORMAL LOW (ref 30.00–100.00)

## 2024-02-26 ENCOUNTER — Telehealth: Payer: Self-pay

## 2024-02-26 ENCOUNTER — Other Ambulatory Visit (HOSPITAL_COMMUNITY): Payer: Self-pay

## 2024-02-26 NOTE — Telephone Encounter (Signed)
 Pharmacy Patient Advocate Encounter  Received notification from [employer] that Prior Authorization for Veozah 45 has been DENIED.  Full denial letter will be uploaded to the media tab. See denial reason below.    PA #/Case ID/Reference #: # [id number]

## 2024-02-26 NOTE — Telephone Encounter (Signed)
 Pharmacy Patient Advocate Encounter   Received notification from Onbase that prior authorization for Veozah 45 tabs is required/requested.   Insurance verification completed.   The patient is insured through [facility] MEDICAID.   Per test claim: PA required; PA submitted to above mentioned insurance via Latent Key/confirmation #/EOC [id number] Status is pending

## 2024-02-26 NOTE — Telephone Encounter (Signed)
 Pharmacy Patient Advocate Encounter  Received notification from [facility] MEDICAID that Prior Authorization for Dexilant  has been APPROVED from 02/26/24 to 02/25/25. Unable to obtain price due to refill too soon rejection, last fill date 02/26/24 next available fill date01/13/26   PA #/Case ID/Reference #: # [id number]

## 2024-02-26 NOTE — Telephone Encounter (Signed)
 Pharmacy Patient Advocate Encounter   Received notification from Onbase that prior authorization for Dexilant  30 is required/requested.   Insurance verification completed.   The patient is insured through [facility] MEDICAID.   Per test claim: PA required; PA submitted to above mentioned insurance via Latent Key/confirmation #/EOC [id number] Status is pending

## 2024-02-29 NOTE — Telephone Encounter (Signed)
 NOTED

## 2024-03-01 NOTE — Telephone Encounter (Signed)
 Copied from CRM [id number]. Topic: Clinical - Medication Prior Auth >> Mar 01, 2024  3:56 PM Franky GRADE wrote: Reason for CRM: Patient is calling to follow up on the prior authorization for Fezolinetant (VEOZAH) 45 MG TABS [[id number]]. She has not gotten an update and when she called the pharmacy they advised she needed to contact the office. She is also asking for the vitamin D  prescription that Tabitha Dugal was going to send over the pharmacy has not received a prescription and it has been about a week since she was seen.

## 2024-03-02 DIAGNOSIS — Z419 Encounter for procedure for purposes other than remedying health state, unspecified: Secondary | ICD-10-CM | POA: Diagnosis not present

## 2024-03-04 ENCOUNTER — Encounter: Payer: Self-pay | Admitting: Family

## 2024-03-04 DIAGNOSIS — R232 Flushing: Secondary | ICD-10-CM

## 2024-03-04 LAB — ALPHA-GAL PANEL
Allergen, Mutton, f88: <0.1 kU/L
Allergen, Pork, f26: <0.1 kU/L
Beef: <0.1 kU/L
CLASS: 0
CLASS: 0
Class: 0
GALACTOSE-ALPHA-1,3-GALACTOSE IGE*: <0.1 kU/L (ref <0.10)

## 2024-03-04 LAB — H. PYLORI BREATH TEST: H. pylori Breath Test: NOT DETECTED

## 2024-03-04 LAB — INTERPRETATION:

## 2024-03-08 ENCOUNTER — Encounter: Payer: Self-pay | Admitting: Family

## 2024-03-08 ENCOUNTER — Other Ambulatory Visit: Payer: Self-pay | Admitting: Family

## 2024-03-08 DIAGNOSIS — R102 Pelvic and perineal pain unspecified side: Secondary | ICD-10-CM

## 2024-03-08 DIAGNOSIS — R1011 Right upper quadrant pain: Secondary | ICD-10-CM

## 2024-03-08 DIAGNOSIS — R1032 Left lower quadrant pain: Secondary | ICD-10-CM

## 2024-03-08 NOTE — Telephone Encounter (Signed)
 Copied from CRM [id number]. Topic: Clinical - Medical Advice >> Mar 08, 2024  8:48 AM Anairis L wrote: Reason for CRM: Patient calling in regarding VEOZAH medication answering Marchia Patrick  question, no history of cancer in family.   03/09/2024 Abdomen Ultrasound.

## 2024-03-09 ENCOUNTER — Ambulatory Visit
Admission: RE | Admit: 2024-03-09 | Discharge: 2024-03-09 | Disposition: A | Discharge status: Home / Self Care | Source: Ambulatory Visit | Attending: Family | Admitting: Family

## 2024-03-09 ENCOUNTER — Ambulatory Visit: Payer: Self-pay | Admitting: Family

## 2024-03-09 DIAGNOSIS — E782 Mixed hyperlipidemia: Secondary | ICD-10-CM

## 2024-03-09 DIAGNOSIS — R1084 Generalized abdominal pain: Secondary | ICD-10-CM | POA: Diagnosis not present

## 2024-03-09 DIAGNOSIS — I77811 Abdominal aortic ectasia: Secondary | ICD-10-CM | POA: Diagnosis not present

## 2024-03-09 DIAGNOSIS — R1011 Right upper quadrant pain: Secondary | ICD-10-CM | POA: Insufficient documentation

## 2024-03-09 DIAGNOSIS — R102 Pelvic and perineal pain unspecified side: Secondary | ICD-10-CM | POA: Insufficient documentation

## 2024-03-09 DIAGNOSIS — R1032 Left lower quadrant pain: Secondary | ICD-10-CM | POA: Diagnosis not present

## 2024-03-14 ENCOUNTER — Other Ambulatory Visit: Payer: Self-pay | Admitting: Family

## 2024-03-14 DIAGNOSIS — R232 Flushing: Secondary | ICD-10-CM

## 2024-03-14 MED ORDER — CLONIDINE HCL 0.1 MG PO TABS: 0.1000 mg | ORAL_TABLET | Freq: Every day | ORAL | 3 refills | Status: AC

## 2024-03-14 NOTE — Addendum Note (Signed)
 Addended by: CORWIN ANTU on: 03/14/2024 02:36 PM   Modules accepted: Orders

## 2024-03-15 DIAGNOSIS — M5416 Radiculopathy, lumbar region: Secondary | ICD-10-CM | POA: Diagnosis not present

## 2024-03-15 DIAGNOSIS — M25551 Pain in right hip: Secondary | ICD-10-CM | POA: Diagnosis not present

## 2024-03-15 DIAGNOSIS — M7061 Trochanteric bursitis, right hip: Secondary | ICD-10-CM | POA: Diagnosis not present

## 2024-03-24 ENCOUNTER — Ambulatory Visit: Admitting: Family

## 2024-03-29 ENCOUNTER — Ambulatory Visit: Admitting: Family Medicine

## 2024-03-29 ENCOUNTER — Ambulatory Visit: Admitting: Family

## 2024-03-29 ENCOUNTER — Encounter: Payer: Self-pay | Admitting: Family Medicine

## 2024-03-29 VITALS — BP 120/70 | HR 95 | Temp 98.7°F | Ht 68.0 in | Wt 164.2 lb

## 2024-03-29 DIAGNOSIS — R051 Acute cough: Secondary | ICD-10-CM

## 2024-03-29 LAB — POCT INFLUENZA A/B
Influenza A, POC: NEGATIVE
Influenza B, POC: NEGATIVE

## 2024-03-29 LAB — POC COVID19 BINAXNOW: SARS Coronavirus 2 Ag: NEGATIVE

## 2024-03-29 MED ORDER — DOXYCYCLINE HYCLATE 100 MG PO TABS: 100.0000 mg | ORAL_TABLET | Freq: Two times a day (BID) | ORAL | 0 refills | Status: AC

## 2024-03-29 MED ORDER — ALBUTEROL SULFATE HFA 108 (90 BASE) MCG/ACT IN AERS: 1.0000 | INHALATION_SPRAY | Freq: Four times a day (QID) | RESPIRATORY_TRACT | 0 refills | Status: AC | PRN

## 2024-03-29 NOTE — Progress Notes (Unsigned)
 Pt complains of cough and dry sore throat that started about 2 weeks ago. Pt complains of wheezing. No fevers.  Some sputum, occ. Rhinorrhea.  No ear pain.  No vomiting.  No diarrhea. Still on singulair .  No recent SABA use.  Prednisone  didn't help cough.    Mult recent sick contacts.    Covid and flu neg.   She had shingles after MRI with contrast, d/w pt.  This doesn't sound like typical allergy, d/w pt.  I need input from PCP.    Coarse BS w/o wheeze, w/o focal dec in BS  Rrr TM wnl Nasal congestion.  OP posterior irritation  Neck supple no LA No BLE edema.  Skin well perfused.

## 2024-03-29 NOTE — Patient Instructions (Signed)
 Rest and fluids.  Take doxy with food, caution for sun exposure.  Use the inhaler if needed.  Take care.  Glad to see you.

## 2024-03-30 DIAGNOSIS — R059 Cough, unspecified: Secondary | ICD-10-CM | POA: Insufficient documentation

## 2024-03-30 NOTE — Assessment & Plan Note (Signed)
 Presumed bronchitis.  Okay for outpatient follow-up.  Discussed options with patient.  She agrees to plan. Rest and fluids.  Take doxy with food, caution for sun exposure.  Use the inhaler if needed.  Update us  as needed.

## 2024-04-06 ENCOUNTER — Other Ambulatory Visit: Payer: Self-pay | Admitting: Orthopedic Surgery

## 2024-04-06 DIAGNOSIS — M5416 Radiculopathy, lumbar region: Secondary | ICD-10-CM

## 2024-04-13 ENCOUNTER — Ambulatory Visit
Admission: RE | Admit: 2024-04-13 | Discharge: 2024-04-13 | Disposition: A | Discharge status: Home / Self Care | Source: Ambulatory Visit | Attending: Orthopedic Surgery | Admitting: Orthopedic Surgery

## 2024-04-13 DIAGNOSIS — M5416 Radiculopathy, lumbar region: Secondary | ICD-10-CM | POA: Insufficient documentation

## 2024-04-20 ENCOUNTER — Other Ambulatory Visit: Payer: Self-pay | Admitting: Family Medicine

## 2024-04-25 ENCOUNTER — Encounter: Payer: Self-pay | Admitting: Family

## 2024-04-25 NOTE — Telephone Encounter (Signed)
 Last given by Dr. Cleatus ok to refill?
